# Patient Record
Sex: Female | Born: 1980 | Race: Black or African American | Hispanic: No | Marital: Single | Smoking: Never smoker
Health system: Southern US, Community
[De-identification: ages and names within clinical notes are randomized; demographics above are authoritative.]

## PROBLEM LIST (undated history)

## (undated) ENCOUNTER — Inpatient Hospital Stay (HOSPITAL_COMMUNITY): Payer: Self-pay

## (undated) DIAGNOSIS — J45909 Unspecified asthma, uncomplicated: Secondary | ICD-10-CM

## (undated) HISTORY — PX: NO PAST SURGERIES: SHX2092

---

## 2002-01-04 ENCOUNTER — Emergency Department (HOSPITAL_COMMUNITY): Admission: EM | Admit: 2002-01-04 | Discharge: 2002-01-04 | Payer: Self-pay | Admitting: Emergency Medicine

## 2005-05-05 ENCOUNTER — Emergency Department (HOSPITAL_COMMUNITY): Admission: EM | Admit: 2005-05-05 | Discharge: 2005-05-05 | Payer: Self-pay | Admitting: Emergency Medicine

## 2013-04-16 ENCOUNTER — Emergency Department (HOSPITAL_COMMUNITY)
Admission: EM | Admit: 2013-04-16 | Discharge: 2013-04-16 | Disposition: A | Discharge status: Home / Self Care | Payer: No Typology Code available for payment source | Attending: Emergency Medicine | Admitting: Emergency Medicine

## 2013-04-16 ENCOUNTER — Encounter (HOSPITAL_COMMUNITY): Payer: Self-pay | Admitting: Emergency Medicine

## 2013-04-16 DIAGNOSIS — J45909 Unspecified asthma, uncomplicated: Secondary | ICD-10-CM | POA: Insufficient documentation

## 2013-04-16 DIAGNOSIS — Z880 Allergy status to penicillin: Secondary | ICD-10-CM | POA: Insufficient documentation

## 2013-04-16 DIAGNOSIS — IMO0002 Reserved for concepts with insufficient information to code with codable children: Secondary | ICD-10-CM | POA: Insufficient documentation

## 2013-04-16 DIAGNOSIS — T148XXA Other injury of unspecified body region, initial encounter: Secondary | ICD-10-CM

## 2013-04-16 DIAGNOSIS — Y92410 Unspecified street and highway as the place of occurrence of the external cause: Secondary | ICD-10-CM | POA: Insufficient documentation

## 2013-04-16 DIAGNOSIS — Y9389 Activity, other specified: Secondary | ICD-10-CM | POA: Insufficient documentation

## 2013-04-16 HISTORY — DX: Unspecified asthma, uncomplicated: J45.909

## 2013-04-16 MED ORDER — CYCLOBENZAPRINE HCL 10 MG PO TABS: 10.0000 mg | ORAL_TABLET | Freq: Three times a day (TID) | ORAL | Status: DC | PRN

## 2013-04-16 MED ORDER — IBUPROFEN 800 MG PO TABS: 800.0000 mg | ORAL_TABLET | Freq: Three times a day (TID) | ORAL | Status: DC | PRN
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
## 2013-04-16 NOTE — ED Provider Notes (Signed)
Medical screening examination/treatment/procedure(s) were performed by non-physician practitioner and as supervising physician I was immediately available for consultation/collaboration.  Krishawna Stiefel M Annalisse Minkoff, MD 04/16/13 0747 

## 2013-08-19 ENCOUNTER — Other Ambulatory Visit: Payer: Self-pay

## 2013-08-19 LAB — OB RESULTS CONSOLE HEPATITIS B SURFACE ANTIGEN: Hepatitis B Surface Ag: NEGATIVE

## 2013-08-19 LAB — OB RESULTS CONSOLE GC/CHLAMYDIA
Chlamydia: NEGATIVE
Gonorrhea: NEGATIVE

## 2013-08-19 LAB — OB RESULTS CONSOLE RUBELLA ANTIBODY, IGM: RUBELLA: IMMUNE

## 2013-09-01 NOTE — L&D Delivery Note (Signed)
Delivery Note At 3:09 PM a viable and healthy female was delivered via Vaginal, Spontaneous Delivery (Presentation: OA, ROT ).  APGAR: 9, 9; weight P.   Placenta status: Intact, Spontaneous.  Cord:  with the following complications: nuchal.    Anesthesia: Epidural  Episiotomy:  none Lacerations: 1st degree perineal Suture Repair: 3.0 vicryl rapide Est. Blood Loss (mL): 400cc  Mom to postpartum.  Baby to Couplet care / Skin to Skin.  Bovard-Stuckert, Roshanna Cimino 03/02/2014, 3:21 PM  A+/Br/RI/Paragard  D/W mother circumcision for female infant inc r/b/a - will proceed

## 2013-12-01 LAB — OB RESULTS CONSOLE HIV ANTIBODY (ROUTINE TESTING): HIV: NONREACTIVE

## 2013-12-21 ENCOUNTER — Inpatient Hospital Stay (HOSPITAL_COMMUNITY): Payer: BC Managed Care – PPO

## 2013-12-21 ENCOUNTER — Inpatient Hospital Stay (HOSPITAL_COMMUNITY)
Admission: AD | Admit: 2013-12-21 | Discharge: 2013-12-22 | Disposition: A | Discharge status: Home / Self Care | Payer: BC Managed Care – PPO | Source: Ambulatory Visit | Attending: Obstetrics and Gynecology | Admitting: Obstetrics and Gynecology

## 2013-12-21 ENCOUNTER — Encounter (HOSPITAL_COMMUNITY): Payer: Self-pay | Admitting: Advanced Practice Midwife

## 2013-12-21 DIAGNOSIS — O99891 Other specified diseases and conditions complicating pregnancy: Secondary | ICD-10-CM | POA: Insufficient documentation

## 2013-12-21 DIAGNOSIS — O9989 Other specified diseases and conditions complicating pregnancy, childbirth and the puerperium: Secondary | ICD-10-CM

## 2013-12-21 DIAGNOSIS — O4690 Antepartum hemorrhage, unspecified, unspecified trimester: Secondary | ICD-10-CM | POA: Insufficient documentation

## 2013-12-21 DIAGNOSIS — O4693 Antepartum hemorrhage, unspecified, third trimester: Secondary | ICD-10-CM

## 2013-12-21 DIAGNOSIS — J45909 Unspecified asthma, uncomplicated: Secondary | ICD-10-CM | POA: Diagnosis not present

## 2013-12-21 LAB — COMPREHENSIVE METABOLIC PANEL
ALK PHOS: 60 U/L (ref 39–117)
ALT: 11 U/L (ref 0–35)
AST: 15 U/L (ref 0–37)
Albumin: 2.8 g/dL — ABNORMAL LOW (ref 3.5–5.2)
BILIRUBIN TOTAL: 0.2 mg/dL — AB (ref 0.3–1.2)
BUN: 6 mg/dL (ref 6–23)
CO2: 21 mEq/L (ref 19–32)
CREATININE: 0.63 mg/dL (ref 0.50–1.10)
Calcium: 8.9 mg/dL (ref 8.4–10.5)
Chloride: 101 mEq/L (ref 96–112)
GFR calc non Af Amer: >90 mL/min (ref >90)
GLUCOSE: 111 mg/dL — AB (ref 70–99)
POTASSIUM: 3.9 meq/L (ref 3.7–5.3)
Sodium: 135 mEq/L — ABNORMAL LOW (ref 137–147)
TOTAL PROTEIN: 6 g/dL (ref 6.0–8.3)

## 2013-12-21 LAB — URIC ACID: URIC ACID, SERUM: 2.7 mg/dL (ref 2.4–7.0)

## 2013-12-21 LAB — CBC
HEMATOCRIT: 30.5 % — AB (ref 36.0–46.0)
HEMOGLOBIN: 10.2 g/dL — AB (ref 12.0–15.0)
MCH: 24.9 pg — ABNORMAL LOW (ref 26.0–34.0)
MCHC: 33.4 g/dL (ref 30.0–36.0)
MCV: 74.4 fL — AB (ref 78.0–100.0)
Platelets: 217 10*3/uL (ref 150–400)
RBC: 4.1 MIL/uL (ref 3.87–5.11)
RDW: 14.4 % (ref 11.5–15.5)
WBC: 9.7 10*3/uL (ref 4.0–10.5)

## 2013-12-21 LAB — TYPE AND SCREEN
ABO/RH(D): A POS
Antibody Screen: NEGATIVE

## 2013-12-21 LAB — LACTATE DEHYDROGENASE: LDH: 204 U/L (ref 94–250)

## 2013-12-21 LAB — ABO/RH: ABO/RH(D): A POS

## 2013-12-21 MED ORDER — SODIUM CHLORIDE 0.9 % IV BOLUS (SEPSIS): 1000.0000 mL | Freq: Once | INTRAVENOUS | Status: DC

## 2013-12-21 NOTE — Progress Notes (Signed)
This note also relates to the following rows which could not be included: Pulse Rate - Cannot attach notes to unvalidated device data SpO2 - Cannot attach notes to unvalidated device data   Pt removed from monitor for bedside ultrasound

## 2013-12-21 NOTE — MAU Provider Note (Signed)
Chief Complaint:  Vaginal Bleeding and Emesis During Pregnancy   First Provider Initiated Contact with Patient 12/21/13 2059     HPI: Randa LynnLatasha C Freimuth is a 33 y.o. G2P0101 at 27.3 weeks who presents to maternity admissions reporting heavy vaginal bleeding that soaked her pants starting immediately before coming to MAU. Puddle of blood on the floor at registration desk.  Denies contractions, abd pain, leakage of fluid, HA, vision changes, epigastric pain. Good fetal movement.   Pregnancy Course: Hx 36 week delivery w/ first child and Gest HTN in that pregnancy. None this pregnancy. Larey SeatFell two months ago when walking on ice. Landed on buttocks. No bleeding or pain at that time.    Past Medical History: Past Medical History  Diagnosis Date  . Asthma     Past obstetric history: OB History  Gravida Para Term Preterm AB SAB TAB Ectopic Multiple Living  1             # Outcome Date GA Lbr Len/2nd Weight Sex Delivery Anes PTL Lv  1 CUR               Past Surgical History: No past surgical history on file.   Family History: No family history on file.  Social History: History  Substance Use Topics  . Smoking status: Never Smoker   . Smokeless tobacco: Not on file  . Alcohol Use: Yes     Comment: social    Allergies:  Allergies  Allergen Reactions  . Shellfish Allergy Anaphylaxis and Hives  . Iodine   . Penicillins Hives    Meds:  No prescriptions prior to admission    ROS: Pertinent findings in history of present illness.  Physical Exam  Blood pressure 138/75, pulse 84, resp. rate 16, last menstrual period 03/21/2013, SpO2 98.00%. Patient Vitals for the past 24 hrs:  BP Pulse Resp SpO2  12/22/13 0105 - - 16 -  12/21/13 2305 - 84 - 98 %  12/21/13 2202 138/75 mmHg 92 - -  12/21/13 2155 128/80 mmHg 102 - -  12/21/13 2112 129/79 mmHg 96 - -  12/21/13 2052 156/89 mmHg 107 - -  12/21/13 2051 160/83 mmHg 100 - -   GENERAL: Well-developed, well-nourished female in no  acute distress. Anxious. HEENT: normocephalic HEART: normal rate RESP: normal effort ABDOMEN: Soft, non-tender, gravid appropriate for gestational age.  EXTREMITIES: Nontender, no edema NEURO: alert and oriented SPECULUM EXAM: NEFG, 6 cm calcified, dark red clot in vagina, small amount of blood in vault, cervix clean. Small amount of blood on pad while in MAU.  Cervix long and closed.   FHT:  Baseline 150, moderate variability, accelerations present, no decelerations Contractions: None, soft   Labs: Results for orders placed during the hospital encounter of 12/21/13 (from the past 24 hour(s))  TYPE AND SCREEN     Status: None   Collection Time    12/21/13  8:50 PM      Result Value Ref Range   ABO/RH(D) A POS     Antibody Screen NEG     Sample Expiration 12/24/2013    CBC     Status: Abnormal   Collection Time    12/21/13  8:50 PM      Result Value Ref Range   WBC 9.7  4.0 - 10.5 K/uL   RBC 4.10  3.87 - 5.11 MIL/uL   Hemoglobin 10.2 (*) 12.0 - 15.0 g/dL   HCT 40.930.5 (*) 81.136.0 - 91.446.0 %   MCV 74.4 (*) 78.0 -  100.0 fL   MCH 24.9 (*) 26.0 - 34.0 pg   MCHC 33.4  30.0 - 36.0 g/dL   RDW 16.114.4  09.611.5 - 04.515.5 %   Platelets 217  150 - 400 K/uL  COMPREHENSIVE METABOLIC PANEL     Status: Abnormal   Collection Time    12/21/13  8:50 PM      Result Value Ref Range   Sodium 135 (*) 137 - 147 mEq/L   Potassium 3.9  3.7 - 5.3 mEq/L   Chloride 101  96 - 112 mEq/L   CO2 21  19 - 32 mEq/L   Glucose, Bld 111 (*) 70 - 99 mg/dL   BUN 6  6 - 23 mg/dL   Creatinine, Ser 4.090.63  0.50 - 1.10 mg/dL   Calcium 8.9  8.4 - 81.110.5 mg/dL   Total Protein 6.0  6.0 - 8.3 g/dL   Albumin 2.8 (*) 3.5 - 5.2 g/dL   AST 15  0 - 37 U/L   ALT 11  0 - 35 U/L   Alkaline Phosphatase 60  39 - 117 U/L   Total Bilirubin 0.2 (*) 0.3 - 1.2 mg/dL   GFR calc non Af Amer >90  >90 mL/min   GFR calc Af Amer >90  >90 mL/min  URIC ACID     Status: None   Collection Time    12/21/13  8:50 PM      Result Value Ref Range   Uric  Acid, Serum 2.7  2.4 - 7.0 mg/dL  LACTATE DEHYDROGENASE     Status: None   Collection Time    12/21/13  8:50 PM      Result Value Ref Range   LDH 204  94 - 250 U/L  ABO/RH     Status: None   Collection Time    12/21/13  8:50 PM      Result Value Ref Range   ABO/RH(D) A POS      Imaging:  Vtx.  Subjectively normal fluid Anterior placenta. No previa No evidence of abruption CL 3.7 cm  MAU Course: CBC, type and screen, IV, NPO, BS limited US to eval placenta.  Bleeding minimal throughout MAU visit. VSS. BP settled down after pt reassured of normal FHR.   Assessment: 1. Third trimester bleeding-suspect partial abruption.     Plan: Discharge home per consult w/ Dr. Ambrose MantleHenley. No BMZ at this time.   Abruption precautions. Preterm labor precautions and fetal kick counts Call 911 if new heavy bleeding returns.      Follow-up Information   Call [facility]. (call in the morning to ask about follow-up appointment)    Contact information:   [address]  [address] [place] [address] [phone]       [facility] up with [facility]. (As needed if bleeding resumes  )    Contact information:   [id number] [place]  [place] [id number] [phone]       Medication List    Notice   You have not been prescribed any medications.     [place] Owenn Rothermel, [place] 12/22/2013 2:53 AM

## 2013-12-21 NOTE — Progress Notes (Signed)
This note also relates to the following rows which could not be included: Pulse Rate - pt

## 2013-12-21 NOTE — MAU Note (Signed)
Pt states she started bleeding bright red blood about 2015

## 2013-12-22 DIAGNOSIS — O4690 Antepartum hemorrhage, unspecified, unspecified trimester: Secondary | ICD-10-CM | POA: Diagnosis not present

## 2013-12-22 NOTE — Discharge Instructions (Signed)
Placental Abruption Placental abruption is when the placenta partially or completely separates from the uterus before the baby (fetus) is born. The placenta is the organ that provides nourishment to the baby. Normally, the placenta does not detach from the womb until after the baby is born. When it is large and separates before the baby is born, it may be a threat to the baby and mother's life. A small abruption may not be noticed until after the birth. Placenta abruption is uncommon. CAUSES  Often times, your caregiver will not know the cause. However, some uncommon causes include:   Abdominal injury.  Turning a baby that is presenting their buttocks first (breech) or is lying sideways in the uterus (transverse) to a headfirst position (external cephalic version).  Delivering the first twin.  Sudden loss of amniotic fluid (premature rupture of the membranes).  Abnormally short umbilical cord. SYMPTOMS  When the placental separation is small, it may not produce symptoms. There may be a small amount of belly (abdominal) pain or slight amount of vaginal bleeding.  Symptoms of severe problems will depend on the size of the separation and the stage of pregnancy. Symptoms may include:   Vaginal bleeding.  Uterine tenderness.  Fetal distress detected by fetal monitoring.  Severe abdominal pain with tenderness.  Continual uterine contraction (tetany).  Back pain.  Maternal shock with severe hemorrhage. RISK FACTORS  History of abruption.  High blood pressure.  Smoking and alcohol intake.  Blood clotting problems.  Too much fluid in the baby's sac (polyhydramnios).  Twins or more.  High blood pressure during pregnancy (preeclampsia) or seizures and convulsions (eclampsia).  Diabetes.  Having had more than four children.  Pregnancy in older women (54 years or older).  Illegal drugs.  Injury or trauma to the abdomen. PREVENTION  Prevention begins with good prenatal  care:  Stop using alcohol, illegal drugs and smoking.  Obey traffic laws and practice defensive driving.  Avoid dangerous activities such as snow and water skiing, horseback riding, motorcycles and mountain climbing.  Wear seat belts properly and at all times.  Control high blood pressure and diabetes.  Avoid situations where there is domestic violence. DIAGNOSIS  Placental abruption is suspected when a pregnant woman develops sudden uterine pain with or without bleeding. The uterus usually is very tender and hard. It may be enlarging because of the bleeding and the fetus may show signs of distress. Distress may show up as an abnormal heart rate or rhythm. When your caregiver sees these signs, they may do an ultrasound test to look for a clot behind the placenta. They will also do blood work to make sure there are not clotting problems, signs of too much blood loss, or not enough healthy red blood cells (anemia). These all require a blood transfusion. TREATMENT  Treatment depends on many things such as:   The amount of bleeding.  Distress with the baby or mother.  How far along the pregnancy is.  The maturity of the baby. This condition is usually an emergency. When the mother or fetus is in distress, it requires treatment right away to protect the safety of the mother and infant. If the baby is mature and delivery time is near:   Careful observation may allow the baby to be delivered vaginally. A vaginal birth is usually preferred over caesarean section unless there is fetal distress.  Sometimes, a caesarean section cannot be done if there are clotting problems or a DIC. If the symptoms are severe and  delivery is not about to happen:   A cesarean section may be done. This is an operation on the abdomen to remove the baby. If the symptoms are mild and there are no signs of distress with the baby or mother:   You may have to stay in the hospital for a couple of days for  observation.  You may be given steroid medication to get the baby's lungs mature when necessary.  If you are Rh negative and the father is Rh positive, you may get Rho-gam to prevent Rh problems in the baby.  When everything is ok and safe, you may go home and be placed on bed rest. HOME CARE INSTRUCTIONS   Take all medications as directed by your caregiver.  Keep all your follow-up prenatal visits.  Arrange for help at home before and after you deliver the baby, especially if you had a Cesarean section or a large amount of bleeding.  Get plenty of rest and sleep, especially after the baby is born.  Eat a nutritious and balanced diet.  Do not have sexual intercourse, use tampons or douche with out your caregiver's permission. SEEK IMMEDIATE MEDICAL CARE IF: Before delivery:  Any type of vaginal bleeding.  Abdominal pain  Continuous uterine contractions.  A hard, tender uterus.  You do not feel the baby move or the baby has very little movement. After delivery:  Started to pass large clots or pieces of tissue. This may be small pieces of placenta left following delivery.  Noticed that you are soaking more than one sanitary pad per hour, for several hours.  Heavy, bright-red bleeding which occurs four days or more after delivery.  A vaginal discharge which has a bad smell.  An unexplained oral temperature above 100 F (37.8 C).  Episodes of lightheadedness or fainting.  Shortness of breath or a rapid heartbeat with very little activity (exertion).  Abdominal pain.  Leg or chest pain. If you are having any of these symptoms, call your caregiver right away. Document Released: 08/18/2005 Document Revised: 11/10/2011 Document Reviewed: 12/07/2008 [facility] Patient Information 2014 [place], [place].   Pelvic Rest Pelvic rest is sometimes recommended for women when:   The placenta is partially or completely covering the opening of the cervix (placenta  previa).  There is bleeding between the uterine wall and the amniotic sac in the first trimester (subchorionic hemorrhage).  The cervix begins to open without labor starting (incompetent cervix, cervical insufficiency).  The labor is too early (preterm labor). HOME CARE INSTRUCTIONS  Do not have sexual intercourse, stimulation, or an orgasm.  Do not use tampons, douche, or put anything in the vagina.  Do not lift anything over 10 pounds (4.5 kg).  Avoid strenuous activity or straining your pelvic muscles. SEEK MEDICAL CARE IF:  You have any vaginal bleeding during pregnancy. Treat this as a potential emergency.  You have cramping pain felt low in the stomach (stronger than menstrual cramps).  You notice vaginal discharge (watery, mucus, or bloody).  You have a low, dull backache.  There are regular contractions or uterine tightening. SEEK IMMEDIATE MEDICAL CARE IF: You have vaginal bleeding and have placenta previa.  Document Released: 12/13/2010 Document Revised: 11/10/2011 Document Reviewed: 12/13/2010 [facility] Patient Information 2014 [place], [place].  Fetal Movement Counts Patient Name: __________________________________________________ Patient Due Date: ____________________ Performing a fetal movement count is highly recommended in high-risk pregnancies, but it is good for every pregnant woman to do. Your caregiver may ask you to start counting fetal movements at  28 weeks of the pregnancy. Fetal movements often increase:  After eating a full meal.  After physical activity.  After eating or drinking something sweet or cold.  At rest. Pay attention to when you feel the baby is most active. This will help you notice a pattern of your baby's sleep and wake cycles and what factors contribute to an increase in fetal movement. It is important to perform a fetal movement count at the same time each day when your baby is normally most active.  HOW TO COUNT FETAL  MOVEMENTS 1. Find a quiet and comfortable area to sit or lie down on your left side. Lying on your left side provides the best blood and oxygen circulation to your baby. 2. Write down the day and time on a sheet of paper or in a journal. 3. Start counting kicks, flutters, swishes, rolls, or jabs in a 2 hour period. You should feel at least 10 movements within 2 hours. 4. If you do not feel 10 movements in 2 hours, wait 2 3 hours and count again. Look for a change in the pattern or not enough counts in 2 hours. SEEK MEDICAL CARE IF:  You feel less than 10 counts in 2 hours, tried twice.  There is no movement in over an hour.  The pattern is changing or taking longer each day to reach 10 counts in 2 hours.  You feel the baby is not moving as he or she usually does. Date: ____________ Movements: ____________ Start time: ____________ Doreatha MartinFinish time: ____________  Date: ____________ Movements: ____________ Start time: ____________ Doreatha MartinFinish time: ____________ Date: ____________ Movements: ____________ Start time: ____________ Doreatha MartinFinish time: ____________ Date: ____________ Movements: ____________ Start time: ____________ Doreatha MartinFinish time: ____________ Date: ____________ Movements: ____________ Start time: ____________ Doreatha MartinFinish time: ____________ Date: ____________ Movements: ____________ Start time: ____________ Doreatha MartinFinish time: ____________ Date: ____________ Movements: ____________ Start time: ____________ Doreatha MartinFinish time: ____________ Date: ____________ Movements: ____________ Start time: ____________ Doreatha MartinFinish time: ____________  Date: ____________ Movements: ____________ Start time: ____________ Doreatha MartinFinish time: ____________ Date: ____________ Movements: ____________ Start time: ____________ Doreatha MartinFinish time: ____________ Date: ____________ Movements: ____________ Start time: ____________ Doreatha MartinFinish time: ____________ Date: ____________ Movements: ____________ Start time: ____________ Doreatha MartinFinish time: ____________ Date: ____________  Movements: ____________ Start time: ____________ Doreatha MartinFinish time: ____________ Date: ____________ Movements: ____________ Start time: ____________ Doreatha MartinFinish time: ____________ Date: ____________ Movements: ____________ Start time: ____________ Doreatha MartinFinish time: ____________  Date: ____________ Movements: ____________ Start time: ____________ Doreatha MartinFinish time: ____________ Date: ____________ Movements: ____________ Start time: ____________ Doreatha MartinFinish time: ____________ Date: ____________ Movements: ____________ Start time: ____________ Doreatha MartinFinish time: ____________ Date: ____________ Movements: ____________ Start time: ____________ Doreatha MartinFinish time: ____________ Date: ____________ Movements: ____________ Start time: ____________ Doreatha MartinFinish time: ____________ Date: ____________ Movements: ____________ Start time: ____________ Doreatha MartinFinish time: ____________ Date: ____________ Movements: ____________ Start time: ____________ Doreatha MartinFinish time: ____________  Date: ____________ Movements: ____________ Start time: ____________ Doreatha MartinFinish time: ____________ Date: ____________ Movements: ____________ Start time: ____________ Doreatha MartinFinish time: ____________ Date: ____________ Movements: ____________ Start time: ____________ Doreatha MartinFinish time: ____________ Date: ____________ Movements: ____________ Start time: ____________ Doreatha MartinFinish time: ____________ Date: ____________ Movements: ____________ Start time: ____________ Doreatha MartinFinish time: ____________ Date: ____________ Movements: ____________ Start time: ____________ Doreatha MartinFinish time: ____________ Date: ____________ Movements: ____________ Start time: ____________ Doreatha MartinFinish time: ____________  Date: ____________ Movements: ____________ Start time: ____________ Doreatha MartinFinish time: ____________ Date: ____________ Movements: ____________ Start time: ____________ Doreatha MartinFinish time: ____________ Date: ____________ Movements: ____________ Start time: ____________ Doreatha MartinFinish time: ____________ Date: ____________ Movements: ____________ Start time:  ____________ Doreatha MartinFinish time: ____________ Date: ____________ Movements: ____________ Start time: ____________ Doreatha MartinFinish time:  ____________ Date: ____________ Movements: ____________ Start time: ____________ Doreatha MartinFinish time: ____________ Date: ____________ Movements: ____________ Start time: ____________ Doreatha MartinFinish time: ____________  Date: ____________ Movements: ____________ Start time: ____________ Doreatha MartinFinish time: ____________ Date: ____________ Movements: ____________ Start time: ____________ Doreatha MartinFinish time: ____________ Date: ____________ Movements: ____________ Start time: ____________ Doreatha MartinFinish time: ____________ Date: ____________ Movements: ____________ Start time: ____________ Doreatha MartinFinish time: ____________ Date: ____________ Movements: ____________ Start time: ____________ Doreatha MartinFinish time: ____________ Date: ____________ Movements: ____________ Start time: ____________ Doreatha MartinFinish time: ____________ Date: ____________ Movements: ____________ Start time: ____________ Doreatha MartinFinish time: ____________  Date: ____________ Movements: ____________ Start time: ____________ Doreatha MartinFinish time: ____________ Date: ____________ Movements: ____________ Start time: ____________ Doreatha MartinFinish time: ____________ Date: ____________ Movements: ____________ Start time: ____________ Doreatha MartinFinish time: ____________ Date: ____________ Movements: ____________ Start time: ____________ Doreatha MartinFinish time: ____________ Date: ____________ Movements: ____________ Start time: ____________ Doreatha MartinFinish time: ____________ Date: ____________ Movements: ____________ Start time: ____________ Doreatha MartinFinish time: ____________ Date: ____________ Movements: ____________ Start time: ____________ Doreatha MartinFinish time: ____________  Date: ____________ Movements: ____________ Start time: ____________ Doreatha MartinFinish time: ____________ Date: ____________ Movements: ____________ Start time: ____________ Doreatha MartinFinish time: ____________ Date: ____________ Movements: ____________ Start time: ____________ Doreatha MartinFinish time: ____________ Date:  ____________ Movements: ____________ Start time: ____________ Doreatha MartinFinish time: ____________ Date: ____________ Movements: ____________ Start time: ____________ Doreatha MartinFinish time: ____________ Date: ____________ Movements: ____________ Start time: ____________ Doreatha MartinFinish time: ____________ Document Released: 09/17/2006 Document Revised: 08/04/2012 Document Reviewed: 06/14/2012 [employer] Patient Information 2014 [employer].

## 2014-01-10 ENCOUNTER — Inpatient Hospital Stay (HOSPITAL_COMMUNITY)
Admission: AD | Admit: 2014-01-10 | Discharge: 2014-01-10 | Disposition: A | Discharge status: Home / Self Care | Payer: BC Managed Care – PPO | Source: Ambulatory Visit | Attending: Obstetrics and Gynecology | Admitting: Obstetrics and Gynecology

## 2014-01-10 ENCOUNTER — Encounter (HOSPITAL_COMMUNITY): Payer: Self-pay | Admitting: *Deleted

## 2014-01-10 DIAGNOSIS — O4700 False labor before 37 completed weeks of gestation, unspecified trimester: Secondary | ICD-10-CM | POA: Insufficient documentation

## 2014-01-10 DIAGNOSIS — Z880 Allergy status to penicillin: Secondary | ICD-10-CM | POA: Insufficient documentation

## 2014-01-10 DIAGNOSIS — O4693 Antepartum hemorrhage, unspecified, third trimester: Secondary | ICD-10-CM

## 2014-01-10 DIAGNOSIS — O4690 Antepartum hemorrhage, unspecified, unspecified trimester: Secondary | ICD-10-CM | POA: Diagnosis present

## 2014-01-10 MED ORDER — BETAMETHASONE SOD PHOS & ACET 6 (3-3) MG/ML IJ SUSP
12.0000 mg | INTRAMUSCULAR | Status: DC
  Administered 2014-01-10: 12 mg via INTRAMUSCULAR
  Filled 2014-01-10: qty 2

## 2014-01-10 NOTE — MAU Note (Signed)
[facility] a [facility]. Pt has also been evaluated here about 3 wks ago.

## 2014-01-10 NOTE — MAU Provider Note (Signed)
Chief Complaint:  Vaginal Bleeding   First Provider Initiated Contact with Patient 01/10/14 2119     HPI: Alicia Butler is a 33 y.o. G2P1 at 6429w2d who was sent to to maternity admissions by Dr. Ellyn HackBovard for betamethasone injection and NST. Had an episode of vaginal bleeding 2 days ago. None now. Has a suspected partial abruption.  Denies contractions, leakage of fluid or vaginal bleeding. Good fetal movement.   Past Medical History: Past Medical History  Diagnosis Date  . Asthma     Past obstetric history: OB History  Gravida Para Term Preterm AB SAB TAB Ectopic Multiple Living  2 1            # Outcome Date GA Lbr Len/2nd Weight Sex Delivery Anes PTL Lv  2 CUR           1 PAR               Past Surgical History: Past Surgical History  Procedure Laterality Date  . No past surgeries       Family History: Family History  Problem Relation Age of Onset  . Asthma Father   . Hypertension Father   . Cancer Paternal Aunt   . Heart disease Paternal Uncle   . Diabetes Maternal Grandmother     Social History: History  Substance Use Topics  . Smoking status: Never Smoker   . Smokeless tobacco: Not on file  . Alcohol Use: Yes     Comment: social    Allergies:  Allergies  Allergen Reactions  . Iodine Anaphylaxis  . Shellfish Allergy Anaphylaxis and Hives  . Penicillins Hives    Meds:  Prescriptions prior to admission  Medication Sig Dispense Refill  . acetaminophen (TYLENOL) 500 MG tablet Take 500 mg by mouth every 6 (six) hours as needed for moderate pain.      Marland Kitchen. albuterol (PROVENTIL HFA;VENTOLIN HFA) 108 (90 BASE) MCG/ACT inhaler Inhale 2 puffs into the lungs every 6 (six) hours as needed for wheezing or shortness of breath.      . Prenatal Vit-Min-FA-Fish Oil ([employer] PRENATAL GUMMY PO) Take 2 tablets by mouth daily.        ROS: Pertinent findings in history of present illness.  Physical Exam  Last menstrual period 03/21/2013. GENERAL: Well-developed,  well-nourished female in no acute distress.  HEENT: normocephalic HEART: normal rate RESP: normal effort ABDOMEN: Soft, non-tender, gravid appropriate for gestational age EXTREMITIES: Nontender, no edema NEURO: alert and oriented SPECULUM EXAM: Deferred   FHT:  Baseline 150 , moderate variability, accelerations present, no decelerations Contractions: none   Labs: No results found for this or any previous visit (from the past 24 hour(s)).  Imaging:  N/A  MAU Course: Betamethasone given.  Assessment: 1. Third trimester bleeding, antepartum    Plan: Discharge home in stable condition per consult with Dr. Ellyn HackBovard. Preterm labor precautions and fetal kick counts  abruption precautions. Follow-up Information   Follow up with Bovard-Stuckert, Augusto GambleJody, MD. (As scheduled or , As needed, If symptoms worsen)    Specialty:  Obstetrics and Gynecology   Contact information:   [address] [place] [id number] [phone]       [facility] In 1 day. (for second steriod shot)    Contact information:   [address] [id number] [place] [id number] [phone]        Medication List         acetaminophen  500 MG tablet  Commonly known as:  TYLENOL  Take 500 mg by mouth every 6 (six) hours as needed for moderate pain.     albuterol 108 (90 BASE) MCG/ACT inhaler  Commonly known as:  PROVENTIL HFA;VENTOLIN HFA  Inhale 2 puffs into the lungs every 6 (six) hours as needed for wheezing or shortness of breath.     [employer] PRENATAL GUMMY PO  Take 2 tablets by mouth daily.       [place] Lavella Myren, CNM 01/10/2014 9:06 PM

## 2014-01-10 NOTE — Discharge Instructions (Signed)
Placental Abruption Placental abruption is when the placenta partially or completely separates from the uterus before the baby (fetus) is born. The placenta is the organ that provides nourishment to the baby. Normally, the placenta does not detach from the womb until after the baby is born. When it is large and separates before the baby is born, it may be a threat to the baby and mother's life. A small abruption may not be noticed until after the birth. Placenta abruption is uncommon. CAUSES  Often times, your caregiver will not know the cause. However, some uncommon causes include:   Abdominal injury.  Turning a baby that is presenting their buttocks first (breech) or is lying sideways in the uterus (transverse) to a headfirst position (external cephalic version).  Delivering the first twin.  Sudden loss of amniotic fluid (premature rupture of the membranes).  Abnormally short umbilical cord. SYMPTOMS  When the placental separation is small, it may not produce symptoms. There may be a small amount of belly (abdominal) pain or slight amount of vaginal bleeding.  Symptoms of severe problems will depend on the size of the separation and the stage of pregnancy. Symptoms may include:   Vaginal bleeding.  Uterine tenderness.  Fetal distress detected by fetal monitoring.  Severe abdominal pain with tenderness.  Continual uterine contraction (tetany).  Back pain.  Maternal shock with severe hemorrhage. RISK FACTORS  History of abruption.  High blood pressure.  Smoking and alcohol intake.  Blood clotting problems.  Too much fluid in the baby's sac (polyhydramnios).  Twins or more.  High blood pressure during pregnancy (preeclampsia) or seizures and convulsions (eclampsia).  Diabetes.  Having had more than four children.  Pregnancy in older women (54 years or older).  Illegal drugs.  Injury or trauma to the abdomen. PREVENTION  Prevention begins with good prenatal  care:  Stop using alcohol, illegal drugs and smoking.  Obey traffic laws and practice defensive driving.  Avoid dangerous activities such as snow and water skiing, horseback riding, motorcycles and mountain climbing.  Wear seat belts properly and at all times.  Control high blood pressure and diabetes.  Avoid situations where there is domestic violence. DIAGNOSIS  Placental abruption is suspected when a pregnant woman develops sudden uterine pain with or without bleeding. The uterus usually is very tender and hard. It may be enlarging because of the bleeding and the fetus may show signs of distress. Distress may show up as an abnormal heart rate or rhythm. When your caregiver sees these signs, they may do an ultrasound test to look for a clot behind the placenta. They will also do blood work to make sure there are not clotting problems, signs of too much blood loss, or not enough healthy red blood cells (anemia). These all require a blood transfusion. TREATMENT  Treatment depends on many things such as:   The amount of bleeding.  Distress with the baby or mother.  How far along the pregnancy is.  The maturity of the baby. This condition is usually an emergency. When the mother or fetus is in distress, it requires treatment right away to protect the safety of the mother and infant. If the baby is mature and delivery time is near:   Careful observation may allow the baby to be delivered vaginally. A vaginal birth is usually preferred over caesarean section unless there is fetal distress.  Sometimes, a caesarean section cannot be done if there are clotting problems or a DIC. If the symptoms are severe and  delivery is not about to happen:   A cesarean section may be done. This is an operation on the abdomen to remove the baby. If the symptoms are mild and there are no signs of distress with the baby or mother:   You may have to stay in the hospital for a couple of days for  observation.  You may be given steroid medication to get the baby's lungs mature when necessary.  If you are Rh negative and the father is Rh positive, you may get Rho-gam to prevent Rh problems in the baby.  When everything is ok and safe, you may go home and be placed on bed rest. HOME CARE INSTRUCTIONS   Take all medications as directed by your caregiver.  Keep all your follow-up prenatal visits.  Arrange for help at home before and after you deliver the baby, especially if you had a Cesarean section or a large amount of bleeding.  Get plenty of rest and sleep, especially after the baby is born.  Eat a nutritious and balanced diet.  Do not have sexual intercourse, use tampons or douche with out your caregiver's permission. SEEK IMMEDIATE MEDICAL CARE IF: Before delivery:  Any type of vaginal bleeding.  Abdominal pain  Continuous uterine contractions.  A hard, tender uterus.  You do not feel the baby move or the baby has very little movement. After delivery:  Started to pass large clots or pieces of tissue. This may be small pieces of placenta left following delivery.  Noticed that you are soaking more than one sanitary pad per hour, for several hours.  Heavy, bright-red bleeding which occurs four days or more after delivery.  A vaginal discharge which has a bad smell.  An unexplained oral temperature above 100 F (37.8 C).  Episodes of lightheadedness or fainting.  Shortness of breath or a rapid heartbeat with very little activity (exertion).  Abdominal pain.  Leg or chest pain. If you are having any of these symptoms, call your caregiver right away. Document Released: 08/18/2005 Document Revised: 11/10/2011 Document Reviewed: 12/07/2008 [facility] Patient Information 2014 [place], [place].  Fetal Movement Counts Patient Name: __________________________________________________ Patient Due Date: ____________________ Performing a fetal movement count is highly  recommended in high-risk pregnancies, but it is good for every pregnant woman to do. Your caregiver may ask you to start counting fetal movements at 28 weeks of the pregnancy. Fetal movements often increase:  After eating a full meal.  After physical activity.  After eating or drinking something sweet or cold.  At rest. Pay attention to when you feel the baby is most active. This will help you notice a pattern of your baby's sleep and wake cycles and what factors contribute to an increase in fetal movement. It is important to perform a fetal movement count at the same time each day when your baby is normally most active.  HOW TO COUNT FETAL MOVEMENTS 1. Find a quiet and comfortable area to sit or lie down on your left side. Lying on your left side provides the best blood and oxygen circulation to your baby. 2. Write down the day and time on a sheet of paper or in a journal. 3. Start counting kicks, flutters, swishes, rolls, or jabs in a 2 hour period. You should feel at least 10 movements within 2 hours. 4. If you do not feel 10 movements in 2 hours, wait 2 3 hours and count again. Look for a change in the pattern or not enough counts in 2  hours. SEEK MEDICAL CARE IF:  You feel less than 10 counts in 2 hours, tried twice.  There is no movement in over an hour.  The pattern is changing or taking longer each day to reach 10 counts in 2 hours.  You feel the baby is not moving as he or she usually does. Date: ____________ Movements: ____________ Start time: ____________ Alicia Butler time: ____________  Date: ____________ Movements: ____________ Start time: ____________ Alicia Butler time: ____________ Date: ____________ Movements: ____________ Start time: ____________ Alicia Butler time: ____________ Date: ____________ Movements: ____________ Start time: ____________ Alicia Butler time: ____________ Date: ____________ Movements: ____________ Start time: ____________ Alicia Butler time: ____________ Date: ____________  Movements: ____________ Start time: ____________ Alicia Butler time: ____________ Date: ____________ Movements: ____________ Start time: ____________ Alicia Butler time: ____________ Date: ____________ Movements: ____________ Start time: ____________ Alicia Butler time: ____________  Date: ____________ Movements: ____________ Start time: ____________ Alicia Butler time: ____________ Date: ____________ Movements: ____________ Start time: ____________ Alicia Butler time: ____________ Date: ____________ Movements: ____________ Start time: ____________ Alicia Butler time: ____________ Date: ____________ Movements: ____________ Start time: ____________ Alicia Butler time: ____________ Date: ____________ Movements: ____________ Start time: ____________ Alicia Butler time: ____________ Date: ____________ Movements: ____________ Start time: ____________ Alicia Butler time: ____________ Date: ____________ Movements: ____________ Start time: ____________ Alicia Butler time: ____________  Date: ____________ Movements: ____________ Start time: ____________ Alicia Butler time: ____________ Date: ____________ Movements: ____________ Start time: ____________ Alicia Butler time: ____________ Date: ____________ Movements: ____________ Start time: ____________ Alicia Butler time: ____________ Date: ____________ Movements: ____________ Start time: ____________ Alicia Butler time: ____________ Date: ____________ Movements: ____________ Start time: ____________ Alicia Butler time: ____________ Date: ____________ Movements: ____________ Start time: ____________ Alicia Butler time: ____________ Date: ____________ Movements: ____________ Start time: ____________ Alicia Butler time: ____________  Date: ____________ Movements: ____________ Start time: ____________ Alicia Butler time: ____________ Date: ____________ Movements: ____________ Start time: ____________ Alicia Butler time: ____________ Date: ____________ Movements: ____________ Start time: ____________ Alicia Butler time: ____________ Date: ____________ Movements: ____________ Start time:  ____________ Alicia Butler time: ____________ Date: ____________ Movements: ____________ Start time: ____________ Alicia Butler time: ____________ Date: ____________ Movements: ____________ Start time: ____________ Alicia Butler time: ____________ Date: ____________ Movements: ____________ Start time: ____________ Alicia Butler time: ____________  Date: ____________ Movements: ____________ Start time: ____________ Alicia Butler time: ____________ Date: ____________ Movements: ____________ Start time: ____________ Alicia Butler time: ____________ Date: ____________ Movements: ____________ Start time: ____________ Alicia Butler time: ____________ Date: ____________ Movements: ____________ Start time: ____________ Alicia Butler time: ____________ Date: ____________ Movements: ____________ Start time: ____________ Alicia Butler time: ____________ Date: ____________ Movements: ____________ Start time: ____________ Alicia Butler time: ____________ Date: ____________ Movements: ____________ Start time: ____________ Alicia Butler time: ____________  Date: ____________ Movements: ____________ Start time: ____________ Alicia Butler time: ____________ Date: ____________ Movements: ____________ Start time: ____________ Alicia Butler time: ____________ Date: ____________ Movements: ____________ Start time: ____________ Alicia Butler time: ____________ Date: ____________ Movements: ____________ Start time: ____________ Alicia Butler time: ____________ Date: ____________ Movements: ____________ Start time: ____________ Alicia Butler time: ____________ Date: ____________ Movements: ____________ Start time: ____________ Alicia Butler time: ____________ Date: ____________ Movements: ____________ Start time: ____________ Alicia Butler time: ____________  Date: ____________ Movements: ____________ Start time: ____________ Alicia Butler time: ____________ Date: ____________ Movements: ____________ Start time: ____________ Alicia Butler time: ____________ Date: ____________ Movements: ____________ Start time: ____________ Alicia Butler time: ____________ Date:  ____________ Movements: ____________ Start time: ____________ Alicia Butler time: ____________ Date: ____________ Movements: ____________ Start time: ____________ Alicia Butler time: ____________ Date: ____________ Movements: ____________ Start time: ____________ Alicia Butler time: ____________ Date: ____________ Movements: ____________ Start time: ____________ Alicia Butler time: ____________  Date: ____________ Movements: ____________ Start time: ____________ Alicia Butler time: ____________ Date: ____________ Movements: ____________ Start time: ____________ Alicia Butler time: ____________ Date: ____________ Movements: ____________ Start time: ____________ Alicia Butler time: ____________ Date: ____________  Movements: ____________ Start time: ____________ Alicia MartinFinish time: ____________ Date: ____________ Movements: ____________ Start time: ____________ Alicia MartinFinish time: ____________ Date: ____________ Movements: ____________ Start time: ____________ Alicia MartinFinish time: ____________ Document Released: 09/17/2006 Document Revised: 08/04/2012 Document Reviewed: 06/14/2012 ExitCare Patient Information 2014 [place], [place].

## 2014-01-11 ENCOUNTER — Inpatient Hospital Stay (HOSPITAL_COMMUNITY)
Admission: AD | Admit: 2014-01-11 | Discharge: 2014-01-11 | Disposition: A | Discharge status: Home / Self Care | Payer: BC Managed Care – PPO | Source: Ambulatory Visit | Attending: Obstetrics and Gynecology | Admitting: Obstetrics and Gynecology

## 2014-01-11 DIAGNOSIS — O4700 False labor before 37 completed weeks of gestation, unspecified trimester: Secondary | ICD-10-CM | POA: Diagnosis present

## 2014-01-11 MED ORDER — BETAMETHASONE SOD PHOS & ACET 6 (3-3) MG/ML IJ SUSP
12.0000 mg | Freq: Once | INTRAMUSCULAR | Status: AC
  Administered 2014-01-11: 12 mg via INTRAMUSCULAR
  Filled 2014-01-11: qty 2

## 2014-01-11 NOTE — MAU Note (Signed)
No complaints. Positive fetal movement. Here for 2nd betamethasone injection only.

## 2014-01-17 ENCOUNTER — Other Ambulatory Visit (HOSPITAL_COMMUNITY): Payer: Self-pay | Admitting: Obstetrics and Gynecology

## 2014-01-17 DIAGNOSIS — O2690 Pregnancy related conditions, unspecified, unspecified trimester: Secondary | ICD-10-CM

## 2014-01-18 ENCOUNTER — Ambulatory Visit (HOSPITAL_COMMUNITY)
Admission: RE | Admit: 2014-01-18 | Discharge: 2014-01-18 | Disposition: A | Discharge status: Home / Self Care | Payer: BC Managed Care – PPO | Source: Ambulatory Visit | Attending: Obstetrics and Gynecology | Admitting: Obstetrics and Gynecology

## 2014-01-18 ENCOUNTER — Other Ambulatory Visit (HOSPITAL_COMMUNITY): Payer: Self-pay | Admitting: Obstetrics and Gynecology

## 2014-01-18 ENCOUNTER — Ambulatory Visit (HOSPITAL_COMMUNITY): Admission: RE | Admit: 2014-01-18 | Payer: BC Managed Care – PPO | Source: Ambulatory Visit

## 2014-01-18 DIAGNOSIS — O9989 Other specified diseases and conditions complicating pregnancy, childbirth and the puerperium: Secondary | ICD-10-CM | POA: Insufficient documentation

## 2014-01-18 DIAGNOSIS — O2690 Pregnancy related conditions, unspecified, unspecified trimester: Secondary | ICD-10-CM

## 2014-01-18 DIAGNOSIS — Z3689 Encounter for other specified antenatal screening: Secondary | ICD-10-CM | POA: Insufficient documentation

## 2014-02-15 ENCOUNTER — Inpatient Hospital Stay (HOSPITAL_COMMUNITY): Payer: BC Managed Care – PPO

## 2014-02-15 ENCOUNTER — Encounter (HOSPITAL_COMMUNITY): Payer: Self-pay | Admitting: *Deleted

## 2014-02-15 ENCOUNTER — Inpatient Hospital Stay (HOSPITAL_COMMUNITY)
Admission: AD | Admit: 2014-02-15 | Discharge: 2014-02-22 | DRG: 782 | Disposition: A | Discharge status: Home / Self Care | Payer: BC Managed Care – PPO | Source: Ambulatory Visit | Attending: Obstetrics and Gynecology | Admitting: Obstetrics and Gynecology

## 2014-02-15 DIAGNOSIS — Z8249 Family history of ischemic heart disease and other diseases of the circulatory system: Secondary | ICD-10-CM | POA: Diagnosis not present

## 2014-02-15 DIAGNOSIS — Z833 Family history of diabetes mellitus: Secondary | ICD-10-CM | POA: Diagnosis not present

## 2014-02-15 DIAGNOSIS — O4700 False labor before 37 completed weeks of gestation, unspecified trimester: Secondary | ICD-10-CM | POA: Diagnosis present

## 2014-02-15 DIAGNOSIS — O4693 Antepartum hemorrhage, unspecified, third trimester: Secondary | ICD-10-CM | POA: Diagnosis present

## 2014-02-15 DIAGNOSIS — O4590 Premature separation of placenta, unspecified, unspecified trimester: Principal | ICD-10-CM | POA: Diagnosis present

## 2014-02-15 DIAGNOSIS — O4593 Premature separation of placenta, unspecified, third trimester: Secondary | ICD-10-CM

## 2014-02-15 DIAGNOSIS — O4690 Antepartum hemorrhage, unspecified, unspecified trimester: Secondary | ICD-10-CM | POA: Diagnosis present

## 2014-02-15 LAB — CBC
HCT: 29.8 % — ABNORMAL LOW (ref 36.0–46.0)
Hemoglobin: 9.5 g/dL — ABNORMAL LOW (ref 12.0–15.0)
MCH: 24.7 pg — AB (ref 26.0–34.0)
MCHC: 31.9 g/dL (ref 30.0–36.0)
MCV: 77.4 fL — ABNORMAL LOW (ref 78.0–100.0)
PLATELETS: 163 10*3/uL (ref 150–400)
RBC: 3.85 MIL/uL — ABNORMAL LOW (ref 3.87–5.11)
RDW: 15.2 % (ref 11.5–15.5)
WBC: 8.7 10*3/uL (ref 4.0–10.5)

## 2014-02-15 MED ORDER — LACTATED RINGERS IV SOLN
INTRAVENOUS | Status: DC
  Administered 2014-02-15: 18:00:00 via INTRAVENOUS

## 2014-02-15 MED ORDER — DOCUSATE SODIUM 100 MG PO CAPS
100.0000 mg | ORAL_CAPSULE | Freq: Every day | ORAL | Status: DC
  Administered 2014-02-18: 100 mg via ORAL
  Filled 2014-02-15 (×4): qty 1

## 2014-02-15 MED ORDER — SODIUM CHLORIDE 0.9 % IJ SOLN
3.0000 mL | Freq: Two times a day (BID) | INTRAMUSCULAR | Status: DC
  Administered 2014-02-15 – 2014-02-19 (×7): 3 mL via INTRAVENOUS

## 2014-02-15 MED ORDER — SODIUM CHLORIDE 0.9 % IJ SOLN: 3.0000 mL | INTRAMUSCULAR | Status: DC | PRN

## 2014-02-15 MED ORDER — CALCIUM CARBONATE ANTACID 500 MG PO CHEW
2.0000 | CHEWABLE_TABLET | ORAL | Status: DC | PRN
  Filled 2014-02-15: qty 2

## 2014-02-15 MED ORDER — COMPLETENATE 29-1 MG PO CHEW
1.0000 | CHEWABLE_TABLET | Freq: Every day | ORAL | Status: DC
  Administered 2014-02-15: 1 via ORAL
  Filled 2014-02-15 (×4): qty 1

## 2014-02-15 MED ORDER — PRENATAL MULTIVITAMIN CH
1.0000 | ORAL_TABLET | Freq: Every day | ORAL | Status: DC
  Filled 2014-02-15: qty 1

## 2014-02-15 MED ORDER — ZOLPIDEM TARTRATE 5 MG PO TABS: 5.0000 mg | ORAL_TABLET | Freq: Every evening | ORAL | Status: DC | PRN

## 2014-02-15 MED ORDER — SODIUM CHLORIDE 0.9 % IV SOLN: 250.0000 mL | INTRAVENOUS | Status: DC | PRN

## 2014-02-15 MED ORDER — ACETAMINOPHEN 325 MG PO TABS
650.0000 mg | ORAL_TABLET | ORAL | Status: DC | PRN
Start: 2014-02-15 — End: 2014-02-22

## 2014-02-15 NOTE — H&P (Signed)
Alicia Butler is a 33 y.o. female G3P1011 at 3835+ with vaginal bleeding.  After voiding this AM, gush of blood with contunued VB.  +FM, no LOF,occ ctx. Has had several episodes of third trimester VB.  Has previously received BMZ for lung development.  Isolated EIF on US Maternal Medical History:  Reason for admission: Vaginal bleeding.   Contractions: Frequency: irregular.   Perceived severity is mild.    Fetal activity: Perceived fetal activity is normal.    Prenatal complications: Bleeding.   Prenatal Complications - Diabetes: none.    OB History   Grav Para Term Preterm Abortions TAB SAB Ect Mult Living   2 1 1       1     G1 TAB, G2 36wk SVD, female 5#13, G3 present partial abruption?. + abn pap, h/o Chl and Trich  Past Medical History  Diagnosis Date  . Asthma   ?CHTN  Past Surgical History  Procedure Laterality Date  . No past surgeries    D&C  Family History: family history includes Asthma in her father; Cancer in her paternal aunt; Diabetes in her maternal grandmother; Heart disease in her paternal uncle; Hypertension in her father. Social History:  reports that she has never smoked. She does not have any smokeless tobacco history on file. She reports that she drinks alcohol. 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   HENT: Negative.   Eyes: Negative.   Respiratory: Negative.    Cardiovascular: Negative.   Gastrointestinal: Negative.   Genitourinary: Negative.   Musculoskeletal: Negative.   Skin: Negative.   Neurological: Negative.   Psychiatric/Behavioral: Negative.     Dilation: Fingertip (outer os) Exam by:: J.Ethier,PA Blood pressure 122/79, pulse 110, temperature 98.1 F (36.7 C), temperature source Oral, resp. rate 20, height 5\' 7"  (1.702 m), weight 116.574 kg (257 lb), last menstrual period 03/21/2013. Maternal Exam:  Uterine Assessment: Contraction frequency is irregular.   Abdomen: Fundal height is appropriate for gestation.   Fetal presentation: vertex  Introitus: Normal vulva. Normal vagina.  Pelvis: adequate for delivery.      Physical Exam  Constitutional: She is oriented to person, place, and time. She appears well-developed and well-nourished.  HENT:  Head: Normocephalic and atraumatic.  Cardiovascular: Normal rate and regular rhythm.   Respiratory: Effort normal and breath sounds normal. No respiratory distress.  GI: Soft. Bowel sounds are normal. She exhibits no distension. There is no tenderness.  Musculoskeletal: Normal range of motion.  Neurological: She is alert and oriented to person, place, and time.  Skin: Skin is warm and dry.  Psychiatric: She has a normal mood and affect. Her behavior is normal.    Prenatal labs: ABO, Rh: --/--/A POS, A POS (04/22 2050) Antibody: NEG (04/22 2050) Rubella: Immune (12/19 0000) RPR:   NR HBsAg: Negative (12/19 0000)  HIV: Non-reactive (04/02 0000)  GBS:   unknown  Hgb 10.9/Ur Cx neg/GC neg/Chl neg/ Varicella nonimmune/Hgb electro - Beta thalasemia minor/Pap WNL HR HPV  neg/First Tri Screen WNL/AFP WNL/glucola 102/Plt 287K  Dated by LMP cw First Tri US US nl AFI, EDC 03/19/14, breech, ant plac, anterior fibroid  Assessment/Plan: 32yo G3P1011 at 35+ with VB admitted for observation S/p BMZ Will get US   Bovard-Stuckert, Jody 02/15/2014, 8:33 PM

## 2014-02-15 NOTE — Progress Notes (Signed)
Ur chart review completed.  

## 2014-02-15 NOTE — MAU Provider Note (Signed)
  History     CSN: [id number]  Arrival date and time: 02/15/14 1108   First Provider Initiated Contact with Patient 02/15/14 1142      Chief Complaint  Patient presents with  . Vaginal Bleeding   HPI [name] is a 33 y.o. G2P1001 at 8345w3d who presents to MAU today with new onset vaginal bleeding. The patient has had multiple other episodes of bleeding during this pregnancy and has been diagnosed with placental abruption. She states that this bleeding has been lighter than previous episodes. She denies contractions or abdominal pain. She reports good fetal movement.   OB History   Grav Para Term Preterm Abortions TAB SAB Ect Mult Living   2 1 1       1       Past Medical History  Diagnosis Date  . Asthma     Past Surgical History  Procedure Laterality Date  . No past surgeries      Family History  Problem Relation Age of Onset  . Asthma Father   . Hypertension Father   . Cancer Paternal Aunt   . Heart disease Paternal Uncle   . Diabetes Maternal Grandmother     History  Substance Use Topics  . Smoking status: Never Smoker   . Smokeless tobacco: Not on file  . Alcohol Use: Yes     Comment: social    Allergies:  Allergies  Allergen Reactions  . Iodine Anaphylaxis  . Shellfish Allergy Anaphylaxis and Hives  . Penicillins Hives    Prescriptions prior to admission  Medication Sig Dispense Refill  . acetaminophen (TYLENOL) 500 MG tablet Take 500 mg by mouth every 6 (six) hours as needed for moderate pain.      Marland Kitchen. albuterol (PROVENTIL HFA;VENTOLIN HFA) 108 (90 BASE) MCG/ACT inhaler Inhale 2 puffs into the lungs every 6 (six) hours as needed for wheezing or shortness of breath.      . Prenatal Vit-Min-FA-Fish Oil ([employer] PRENATAL GUMMY PO) Take 2 tablets by mouth daily.        Review of Systems  Constitutional: Negative for malaise/fatigue.  Gastrointestinal: Negative for abdominal pain.  Genitourinary:       + vaginal bleeding  Neurological: Negative  for dizziness, loss of consciousness and weakness.   Physical Exam   Blood pressure 131/72, pulse 117, temperature 98.3 F (36.8 C), temperature source Oral, resp. rate 18, last menstrual period 03/21/2013.  Physical Exam  Constitutional: She is oriented to person, place, and time. She appears well-developed and well-nourished. No distress.  HENT:  Head: Normocephalic and atraumatic.  Cardiovascular: Normal rate, regular rhythm and normal heart sounds.   Respiratory: Effort normal and breath sounds normal. No respiratory distress.  GI: Soft. She exhibits no distension and no mass. There is no tenderness. There is no rebound and no guarding.  Genitourinary: There is bleeding (small amount of blood in the vagina oozing from the cervix) around the vagina. No vaginal discharge found.  Neurological: She is alert and oriented to person, place, and time.  Skin: Skin is warm and dry. No erythema.  Psychiatric: She has a normal mood and affect.    Fetal Monitoring: Baseline: 140 bpm, moderate variability, + accelerations, + decelarations Contractions: occasional, irregular  MAU Course  Procedures None  MDM Discussed with Dr. Ellyn HackBovard. Admit to antenatal  Assessment and Plan  A: SIUP at 8845w3d Placental abruption  P: Admit to antenatal  Freddi StarrJulie N Ethier, PA-C  02/15/2014, 11:42 AM

## 2014-02-15 NOTE — MAU Note (Signed)
Pt reports this is the third time she has had bleeding like this. Told she has a partial placenta abruption. Denies any pain or cramping at this time.

## 2014-02-16 MED ORDER — TETANUS-DIPHTH-ACELL PERTUSSIS 5-2.5-18.5 LF-MCG/0.5 IM SUSP
0.5000 mL | Freq: Once | INTRAMUSCULAR | Status: AC
  Administered 2014-02-16: 0.5 mL via INTRAMUSCULAR
  Filled 2014-02-16: qty 0.5

## 2014-02-16 NOTE — Progress Notes (Signed)
Patient ID: Alicia Butler, female   DOB: 06/23/1981, 33 y.o.   MRN: [id number]  No c/o's. No active VB.  +FM, no LOF, no VB, occ, rare ctx  AFVSS gen NAD Abd soft, NT FHTs 135-140, category 1 toco rare  32yo G3P1011 at 35+ with heavy VB yesterday, no obiovus abruption on US, but likely partial abruption Will monitor for now Reviewed US, good growth, vtx, ant plac, fibroid SLIV Decrease monitoring to TID NST

## 2014-02-16 NOTE — Progress Notes (Signed)
No active bleeding, some dark discharge FHT-reactive She inquires about Tdap, did not get in the office, will order now

## 2014-02-17 MED ORDER — PRENATAL MULTIVITAMIN CH
1.0000 | ORAL_TABLET | Freq: Every day | ORAL | Status: DC
  Administered 2014-02-17 – 2014-02-21 (×5): 1 via ORAL
  Filled 2014-02-17 (×5): qty 1

## 2014-02-17 NOTE — Progress Notes (Addendum)
Patient ID: Kinnie FeilLatasha Butler, female   DOB: 05/12/1981, 33 y.o.   MRN: [id number] 32yo [id number] with ? Partial abruption, admitted after bleed  No c/o's.  +FM, no LOF, sm brown d/c, this AM some bright red clots, occ ctx  AFVSS gen NAD Abd soft, gravid, NT FHTs 130-150, category 1 toco occ, irr  Continue current management Observation

## 2014-02-17 NOTE — Progress Notes (Signed)
Patient ID: Alicia FeilLatasha Butler, female   DOB: 10/03/1980, 33 y.o.   MRN: [id number] No further VB throughout day, brown spotting only  FHR reassuring  35 5/7 continue current care

## 2014-02-18 NOTE — Progress Notes (Signed)
Patient ID: Alicia Butler, female   DOB: 03/04/1981, 33 y.o.   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  In AM of 6/19 had a smaller amount of BRB, since then only scant brown spotting Great FM, NST's are Category 1 Abdomen soft, no pain  Pt asking when she can go home. We discussed MFM recommendation of in-house monitoring for 5-7 days of no VB before d/c We also discussed that if she continues to bleed intermittently she may need early delivery at 37 weeks,  But hopefully will stop and make it to 39 weeks.   We discussed IOL vs C/S if bleeding was heavy enough to threaten her or stress the baby.

## 2014-02-19 NOTE — Progress Notes (Signed)
Patient ID: Alicia Butler, female   DOB: 12/10/1980, 33 y.o.   MRN: [id number] Pt 36 0/7 weeks probable chronic partial abruption  No VB since Friday AM, just brown spotting No pain, Good FM  afeb vss  Abd soft  FHR tracings Category 1 Remaining in-house until 5-7 days of no bleeding Saline lock expires today, will not replace--has good IV Access Type and Screen good until midnight, if stable will not renew

## 2014-02-20 LAB — TYPE AND SCREEN
ABO/RH(D): A POS
ABO/RH(D): A POS
ANTIBODY SCREEN: NEGATIVE
ANTIBODY SCREEN: NEGATIVE

## 2014-02-20 NOTE — Progress Notes (Signed)
Ur chart review completed.  

## 2014-02-20 NOTE — Progress Notes (Signed)
Patient ID: Alicia Butler, female   DOB: 02/17/1981, 33 y.o.   MRN: [id number]  32yo G3P1011 at 36+ with chronic partial abruption  +FM, no LOF, only small old blood, no active bleeding, occ ctx  AFVSS gen NAD Abd soft, gravid FHT 140 category 1 toco occ  Doing well. Continue current management

## 2014-02-21 ENCOUNTER — Inpatient Hospital Stay (HOSPITAL_COMMUNITY): Payer: BC Managed Care – PPO

## 2014-02-21 LAB — GROUP B STREP BY PCR: Group B strep by PCR: NEGATIVE

## 2014-02-21 MED ORDER — ONDANSETRON HCL 4 MG PO TABS
4.0000 mg | ORAL_TABLET | Freq: Three times a day (TID) | ORAL | Status: DC | PRN
  Administered 2014-02-22: 4 mg via ORAL
  Filled 2014-02-21: qty 1

## 2014-02-21 NOTE — Progress Notes (Signed)
Patient ID: Kinnie FeilLatasha Rule, female   DOB: 11/18/1980, 33 y.o.   MRN: [id number]  32yo G3P1011 at 36+2 with likely partial abruption  +FM, no LOF, no active VB - only small dark blood  AFVSS gen NAD Abd soft, gravid  FHTs 130-140, category 1 toco occ  Continue current mgmt, d/w MFM delivery plan

## 2014-02-22 NOTE — Progress Notes (Signed)
Pt. Is stable and ready to be discharged home. All discharge instructions reviewed and given to patient. All questions answered. Pt. Will follow up with office tomorrow. Pt. Wheeled out via wheelchair to friend's car. All belongings with the patient.

## 2014-02-22 NOTE — Progress Notes (Signed)
Patient ID: Alicia Butler, female   DOB: 02/19/1981, 33 y.o.   MRN: [id number]  32yo G3P1011 at 36+ with chronic partial abruption - no VB for 5 days.  D/c to home IOL 03/02/14  No c/o's.  +FM, no LOF, no active VB, occ ctx  AFVSS gen NAD Abd soft,gravid, NT  FHT 140's R toco occ  Partial abruption, iol 03/02/14 - per pt request, per MFM reccs - scheduled 7:00am.  NST tomorrow and appt.  NST Tuesday.  Bleeding precautions

## 2014-02-22 NOTE — Discharge Summary (Signed)
Obstetric Discharge Summary Reason for Admission: VB - partial abruption Prenatal Procedures: NST and US Intrapartum Procedures: N/A Postpartum Procedures: N/A Complications-Operative and Postpartum: N/A Hemoglobin  Date Value Ref Range Status  02/15/2014 9.5* 12.0 - 15.0 g/dL Final     HCT  Date Value Ref Range Status  02/15/2014 29.8* 36.0 - 46.0 % Final    Physical Exam:  General: alert and no distress Abd NT, gravid FHTs 140's category 1  Discharge Diagnoses: Antepartum bleeding  Discharge Information: Date: 02/22/2014 Activity: pelvic rest Diet: routine Medications: PNV Condition: stable Instructions: labor and bleeding precautions Discharge to: home Follow-up Information   Follow up with Bovard-Stuckert, Augusto GambleJody, MD In 1 day. (As scheduled - NST)    Specialty:  Obstetrics and Gynecology   Contact information:   [address] [place] [address] [phone]       Bovard-Stuckert, Augusto GambleJody 02/22/2014, 8:35 AM

## 2014-02-28 ENCOUNTER — Encounter (HOSPITAL_COMMUNITY): Payer: Self-pay | Admitting: *Deleted

## 2014-02-28 ENCOUNTER — Telehealth (HOSPITAL_COMMUNITY): Payer: Self-pay | Admitting: *Deleted

## 2014-02-28 NOTE — Telephone Encounter (Signed)
Preadmission screen  

## 2014-03-01 MED ORDER — ALBUTEROL SULFATE HFA 108 (90 BASE) MCG/ACT IN AERS: 2.0000 | INHALATION_SPRAY | Freq: Four times a day (QID) | RESPIRATORY_TRACT | Status: DC | PRN

## 2014-03-01 NOTE — H&P (Signed)
Alicia Butler is a 33 y.o. female G3P1011 at 37+ for IOL given 3rd trimester VB, likely partial abruption.  D/W MFM, pt unable to have iol until 03/02/14. Pt had several bleeds, admitted at 35 wk.  Received BMZ in 5/15. Multiple ultraounds have not revealed abruption.     Maternal Medical History:  Contractions: Frequency: irregular.   Perceived severity is moderate.    Fetal activity: Perceived fetal activity is normal.    Prenatal complications: Bleeding.   Prenatal Complications - Diabetes: none.    OB History   Grav Para Term Preterm Abortions TAB SAB Ect Mult Living   3 1 0 1 1  1   1     G1 TAB, G2 36wk IOL PIH/oligo, G3 present; + abn pap, nl colpo, nl pap w/ pregnancy.  H/o Chl and trich  Past Medical History  Diagnosis Date  . Asthma   several fractures  Past Surgical History  Procedure Laterality Date  . No past surgeries     Family History: family history includes Asthma in her father; Cancer in her paternal aunt; Diabetes in her maternal grandmother and mother; Heart disease in her paternal uncle; Hypertension in her father. Social History:  reports that she has never smoked. She has never used smokeless tobacco. She reports that she drinks alcohol. 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   HENT: Negative.   Eyes: Negative.   Respiratory: Negative.   Cardiovascular: Negative.   Gastrointestinal:  Negative.   Genitourinary: Negative.   Musculoskeletal: Negative.   Skin: Negative.   Neurological: Negative.   Psychiatric/Behavioral: Negative.       Last menstrual period 03/21/2013. Maternal Exam:  Uterine Assessment: Contraction strength is moderate.  Abdomen: Fundal height is appropriate for gestation.   Estimated fetal weight is 7#.   Fetal presentation: vertex  Introitus: Normal vulva. Normal vagina.  Pelvis: adequate for delivery.   Cervix: Cervix evaluated by digital exam.     Physical Exam  Constitutional: She is oriented to person, place, and time. She appears well-developed and well-nourished.  HENT:  Head: Normocephalic and atraumatic.  Cardiovascular: Normal rate and regular rhythm.   Respiratory: Effort normal and breath sounds normal. No respiratory distress. She has no wheezes.  GI: Soft. Bowel sounds are normal. She exhibits no distension. There is no tenderness.  Musculoskeletal: Normal range of motion.  Neurological: She is alert and oriented to person, place, and time.  Skin: Skin is warm and dry.  Psychiatric: She has a normal mood and affect. Her behavior is normal.    Prenatal labs: ABO, Rh: --/--/A POS (06/22 1623) Antibody: NEG (06/22 1623) Rubella: Immune (12/19 0000) RPR:   NR HBsAg: Negative (12/19 0000)  HIV: Non-reactive (04/02 0000)  GBS:   negative  Hgb 10.9/Ur Cx neg/Chl neg/ GC neg/Varicella Nonimmune/Plt 287K/Hgb electro - Bthal minor/pap WNL HR HPV neg/First Tri and AFP WNL/glucola 102  Flu 10/14  US EDC 7/19 cwd Nl AFI, br, ant plac, 2.5cm ant plac,  nl anat EFW 6/17 6#10  Assessment/Plan: 32yo G3P1011 at 37+ for IOL given likely partial abruption GBBS neg - no prophylaxis AROM and pitocin Expect SVD   Bovard-Stuckert, Ozie Dimaria 03/01/2014, 8:42 PM

## 2014-03-02 ENCOUNTER — Encounter (HOSPITAL_COMMUNITY): Payer: Self-pay

## 2014-03-02 ENCOUNTER — Inpatient Hospital Stay (HOSPITAL_COMMUNITY)
Admission: AD | Admit: 2014-03-02 | Discharge: 2014-03-04 | DRG: 774 | Disposition: A | Discharge status: Home / Self Care | Payer: BC Managed Care – PPO | Source: Ambulatory Visit | Attending: Obstetrics and Gynecology | Admitting: Obstetrics and Gynecology

## 2014-03-02 ENCOUNTER — Encounter (HOSPITAL_COMMUNITY): Payer: BC Managed Care – PPO | Admitting: Anesthesiology

## 2014-03-02 ENCOUNTER — Inpatient Hospital Stay (HOSPITAL_COMMUNITY): Payer: BC Managed Care – PPO | Admitting: Anesthesiology

## 2014-03-02 DIAGNOSIS — Z6841 Body Mass Index (BMI) 40.0 and over, adult: Secondary | ICD-10-CM

## 2014-03-02 DIAGNOSIS — Z833 Family history of diabetes mellitus: Secondary | ICD-10-CM

## 2014-03-02 DIAGNOSIS — O99214 Obesity complicating childbirth: Secondary | ICD-10-CM

## 2014-03-02 DIAGNOSIS — Z8249 Family history of ischemic heart disease and other diseases of the circulatory system: Secondary | ICD-10-CM

## 2014-03-02 DIAGNOSIS — O4693 Antepartum hemorrhage, unspecified, third trimester: Secondary | ICD-10-CM | POA: Diagnosis present

## 2014-03-02 DIAGNOSIS — O4690 Antepartum hemorrhage, unspecified, unspecified trimester: Secondary | ICD-10-CM | POA: Diagnosis present

## 2014-03-02 DIAGNOSIS — E669 Obesity, unspecified: Secondary | ICD-10-CM | POA: Diagnosis present

## 2014-03-02 LAB — TYPE AND SCREEN
ABO/RH(D): A POS
Antibody Screen: NEGATIVE

## 2014-03-02 LAB — CBC
HCT: 32.1 % — ABNORMAL LOW (ref 36.0–46.0)
HEMOGLOBIN: 10.3 g/dL — AB (ref 12.0–15.0)
MCH: 24.8 pg — ABNORMAL LOW (ref 26.0–34.0)
MCHC: 32.1 g/dL (ref 30.0–36.0)
MCV: 77.3 fL — AB (ref 78.0–100.0)
Platelets: 183 10*3/uL (ref 150–400)
RBC: 4.15 MIL/uL (ref 3.87–5.11)
RDW: 15.9 % — ABNORMAL HIGH (ref 11.5–15.5)
WBC: 7.6 10*3/uL (ref 4.0–10.5)

## 2014-03-02 LAB — OB RESULTS CONSOLE GBS: STREP GROUP B AG: NEGATIVE

## 2014-03-02 LAB — RPR

## 2014-03-02 MED ORDER — TERBUTALINE SULFATE 1 MG/ML IJ SOLN: 0.2500 mg | Freq: Once | INTRAMUSCULAR | Status: DC | PRN

## 2014-03-02 MED ORDER — LACTATED RINGERS IV SOLN: INTRAVENOUS | Status: DC

## 2014-03-02 MED ORDER — EPHEDRINE 5 MG/ML INJ
10.0000 mg | INTRAVENOUS | Status: DC | PRN
  Filled 2014-03-02: qty 4
  Filled 2014-03-02: qty 2

## 2014-03-02 MED ORDER — LACTATED RINGERS IV SOLN
500.0000 mL | INTRAVENOUS | Status: DC | PRN
Start: 2014-03-02 — End: 2014-03-02

## 2014-03-02 MED ORDER — PRENATAL MULTIVITAMIN CH
1.0000 | ORAL_TABLET | Freq: Every day | ORAL | Status: DC
  Administered 2014-03-04: 1 via ORAL
  Filled 2014-03-02: qty 1

## 2014-03-02 MED ORDER — FENTANYL 2.5 MCG/ML BUPIVACAINE 1/10 % EPIDURAL INFUSION (WH - ANES)
14.0000 mL/h | INTRAMUSCULAR | Status: DC | PRN
  Administered 2014-03-02: 14 mL/h via EPIDURAL
  Filled 2014-03-02: qty 125

## 2014-03-02 MED ORDER — ONDANSETRON HCL 4 MG PO TABS: 4.0000 mg | ORAL_TABLET | ORAL | Status: DC | PRN

## 2014-03-02 MED ORDER — BUTORPHANOL TARTRATE 1 MG/ML IJ SOLN: 1.0000 mg | INTRAMUSCULAR | Status: DC | PRN

## 2014-03-02 MED ORDER — OXYTOCIN 40 UNITS IN LACTATED RINGERS INFUSION - SIMPLE MED
1.0000 m[IU]/min | INTRAVENOUS | Status: DC
  Administered 2014-03-02: 2 m[IU]/min via INTRAVENOUS
  Filled 2014-03-02: qty 1000

## 2014-03-02 MED ORDER — SIMETHICONE 80 MG PO CHEW: 80.0000 mg | CHEWABLE_TABLET | ORAL | Status: DC | PRN

## 2014-03-02 MED ORDER — CITRIC ACID-SODIUM CITRATE 334-500 MG/5ML PO SOLN: 30.0000 mL | ORAL | Status: DC | PRN

## 2014-03-02 MED ORDER — IBUPROFEN 600 MG PO TABS
600.0000 mg | ORAL_TABLET | Freq: Four times a day (QID) | ORAL | Status: DC
  Administered 2014-03-02 – 2014-03-04 (×7): 600 mg via ORAL
  Filled 2014-03-02 (×9): qty 1

## 2014-03-02 MED ORDER — PHENYLEPHRINE 40 MCG/ML (10ML) SYRINGE FOR IV PUSH (FOR BLOOD PRESSURE SUPPORT)
80.0000 ug | PREFILLED_SYRINGE | INTRAVENOUS | Status: DC | PRN
  Filled 2014-03-02: qty 10
  Filled 2014-03-02: qty 2

## 2014-03-02 MED ORDER — DIBUCAINE 1 % RE OINT
1.0000 "application " | TOPICAL_OINTMENT | RECTAL | Status: DC | PRN
  Administered 2014-03-04: 1 via RECTAL
  Filled 2014-03-02: qty 28

## 2014-03-02 MED ORDER — ACETAMINOPHEN 325 MG PO TABS: 650.0000 mg | ORAL_TABLET | ORAL | Status: DC | PRN

## 2014-03-02 MED ORDER — IBUPROFEN 600 MG PO TABS: 600.0000 mg | ORAL_TABLET | Freq: Four times a day (QID) | ORAL | Status: DC | PRN

## 2014-03-02 MED ORDER — TETANUS-DIPHTH-ACELL PERTUSSIS 5-2.5-18.5 LF-MCG/0.5 IM SUSP: 0.5000 mL | Freq: Once | INTRAMUSCULAR | Status: DC

## 2014-03-02 MED ORDER — BENZOCAINE-MENTHOL 20-0.5 % EX AERO
1.0000 "application " | INHALATION_SPRAY | CUTANEOUS | Status: DC | PRN
  Administered 2014-03-02: 1 via TOPICAL
  Filled 2014-03-02 (×2): qty 56

## 2014-03-02 MED ORDER — SENNOSIDES-DOCUSATE SODIUM 8.6-50 MG PO TABS
2.0000 | ORAL_TABLET | ORAL | Status: DC
  Filled 2014-03-02 (×2): qty 2

## 2014-03-02 MED ORDER — LIDOCAINE HCL (PF) 1 % IJ SOLN
30.0000 mL | INTRAMUSCULAR | Status: DC | PRN
  Filled 2014-03-02: qty 30

## 2014-03-02 MED ORDER — DIPHENHYDRAMINE HCL 50 MG/ML IJ SOLN: 12.5000 mg | INTRAMUSCULAR | Status: DC | PRN

## 2014-03-02 MED ORDER — FENTANYL 2.5 MCG/ML BUPIVACAINE 1/10 % EPIDURAL INFUSION (WH - ANES)
INTRAMUSCULAR | Status: DC | PRN
  Administered 2014-03-02: 14 mL/h via EPIDURAL

## 2014-03-02 MED ORDER — WITCH HAZEL-GLYCERIN EX PADS: 1.0000 "application " | MEDICATED_PAD | CUTANEOUS | Status: DC | PRN

## 2014-03-02 MED ORDER — LACTATED RINGERS IV SOLN
500.0000 mL | Freq: Once | INTRAVENOUS | Status: AC
  Administered 2014-03-02: 500 mL via INTRAVENOUS

## 2014-03-02 MED ORDER — OXYTOCIN BOLUS FROM INFUSION: 500.0000 mL | INTRAVENOUS | Status: DC

## 2014-03-02 MED ORDER — OXYCODONE-ACETAMINOPHEN 5-325 MG PO TABS: 1.0000 | ORAL_TABLET | ORAL | Status: DC | PRN

## 2014-03-02 MED ORDER — DIPHENHYDRAMINE HCL 25 MG PO CAPS
25.0000 mg | ORAL_CAPSULE | Freq: Four times a day (QID) | ORAL | Status: DC | PRN
Start: 2014-03-02 — End: 2014-03-04

## 2014-03-02 MED ORDER — ONDANSETRON HCL 4 MG/2ML IJ SOLN: 4.0000 mg | Freq: Four times a day (QID) | INTRAMUSCULAR | Status: DC | PRN

## 2014-03-02 MED ORDER — LANOLIN HYDROUS EX OINT: TOPICAL_OINTMENT | CUTANEOUS | Status: DC | PRN

## 2014-03-02 MED ORDER — LIDOCAINE HCL (PF) 1 % IJ SOLN
INTRAMUSCULAR | Status: DC | PRN
  Administered 2014-03-02 (×2): 8 mL

## 2014-03-02 MED ORDER — ONDANSETRON HCL 4 MG/2ML IJ SOLN: 4.0000 mg | INTRAMUSCULAR | Status: DC | PRN

## 2014-03-02 MED ORDER — EPHEDRINE 5 MG/ML INJ
10.0000 mg | INTRAVENOUS | Status: DC | PRN
  Filled 2014-03-02: qty 2

## 2014-03-02 MED ORDER — PHENYLEPHRINE 40 MCG/ML (10ML) SYRINGE FOR IV PUSH (FOR BLOOD PRESSURE SUPPORT)
80.0000 ug | PREFILLED_SYRINGE | INTRAVENOUS | Status: DC | PRN
  Filled 2014-03-02: qty 2

## 2014-03-02 MED ORDER — ZOLPIDEM TARTRATE 5 MG PO TABS: 5.0000 mg | ORAL_TABLET | Freq: Every evening | ORAL | Status: DC | PRN

## 2014-03-02 MED ORDER — OXYTOCIN 40 UNITS IN LACTATED RINGERS INFUSION - SIMPLE MED
62.5000 mL/h | INTRAVENOUS | Status: DC
  Administered 2014-03-02: 62.5 mL/h via INTRAVENOUS

## 2014-03-02 NOTE — Progress Notes (Signed)
Patient ID: Kinnie FeilLatasha Butler, female   DOB: 01/29/1981, 33 y.o.   MRN: [id number]  H&P reviewed, no changes  AFVSS gen NAD FHTs 140's category 1, moderate variability toco irr  SVE 1/50/-2 per RN Will AROM after Pitocin at lunch time

## 2014-03-02 NOTE — Anesthesia Preprocedure Evaluation (Signed)
Anesthesia Evaluation  Patient identified by MRN, date of birth, ID band Patient awake    Reviewed: Allergy & Precautions, H&P , NPO status , Patient's Chart, lab work & pertinent test results  Airway Mallampati: II TM Distance: >3 FB Neck ROM: full    Dental no notable dental hx.    Pulmonary    Pulmonary exam normal       Cardiovascular negative cardio ROS      Neuro/Psych negative neurological ROS  negative psych ROS   GI/Hepatic negative GI ROS, Neg liver ROS,   Endo/Other  Morbid obesity  Renal/GU negative Renal ROS     Musculoskeletal   Abdominal (+) + obese,   Peds  Hematology negative hematology ROS (+)   Anesthesia Other Findings   Reproductive/Obstetrics (+) Pregnancy                           Anesthesia Physical Anesthesia Plan  ASA: III  Anesthesia Plan: Epidural   Post-op Pain Management:    Induction:   Airway Management Planned:   Additional Equipment:   Intra-op Plan:   Post-operative Plan:   Informed Consent: I have reviewed the patients History and Physical, chart, labs and discussed the procedure including the risks, benefits and alternatives for the proposed anesthesia with the patient or authorized representative who has indicated his/her understanding and acceptance.     Plan Discussed with:   Anesthesia Plan Comments:         Anesthesia Quick Evaluation  

## 2014-03-02 NOTE — Anesthesia Procedure Notes (Signed)
Epidural Patient location during procedure: OB Start time: 03/02/2014 11:48 AM End time: 03/02/2014 11:52 AM  Staffing Anesthesiologist: Leilani AbleHATCHETT, Raela Bohl Performed by: anesthesiologist   Preanesthetic Checklist Completed: patient identified, surgical consent, pre-op evaluation, timeout performed, IV checked, risks and benefits discussed and monitors and equipment checked  Epidural Patient position: sitting Prep: site prepped and draped and DuraPrep Patient monitoring: continuous pulse ox and blood pressure Approach: midline Location: L3-L4 Injection technique: LOR air  Needle:  Needle type: Tuohy  Needle gauge: 17 G Needle length: 9 cm and 9 Needle insertion depth: 7 cm Catheter type: closed end flexible Catheter size: 19 Gauge Catheter at skin depth: 12 cm Test dose: negative and Other  Assessment Sensory level: T9 Events: blood not aspirated, injection not painful, no injection resistance, negative IV test and no paresthesia  Additional Notes Reason for block:procedure for pain

## 2014-03-03 LAB — CBC
HCT: 28.8 % — ABNORMAL LOW (ref 36.0–46.0)
Hemoglobin: 9.1 g/dL — ABNORMAL LOW (ref 12.0–15.0)
MCH: 24.7 pg — AB (ref 26.0–34.0)
MCHC: 31.6 g/dL (ref 30.0–36.0)
MCV: 78 fL (ref 78.0–100.0)
PLATELETS: 154 10*3/uL (ref 150–400)
RBC: 3.69 MIL/uL — ABNORMAL LOW (ref 3.87–5.11)
RDW: 15.6 % — ABNORMAL HIGH (ref 11.5–15.5)
WBC: 11.9 10*3/uL — ABNORMAL HIGH (ref 4.0–10.5)

## 2014-03-03 NOTE — Anesthesia Postprocedure Evaluation (Signed)
  Anesthesia Post-op Note  Patient: Alicia FeilLatasha Pick  Procedure(s) Performed: * No procedures listed *  Patient Location: PACU and Mother/Baby  Anesthesia Type:Epidural  Level of Consciousness: awake, alert  and oriented  Airway and Oxygen Therapy: Patient Spontanous Breathing  Post-op Pain: mild  Post-op Assessment: Patient's Cardiovascular Status Stable, Respiratory Function Stable, No signs of Nausea or vomiting, Pain level controlled, No headache, No backache, No residual numbness and No residual motor weakness  Post-op Vital Signs: Reviewed and stable  Last Vitals:  Filed Vitals:   03/03/14 0500  BP: 122/79  Pulse: 73  Temp: 36.6 C  Resp: 20    Complications: No apparent anesthesia complications

## 2014-03-03 NOTE — Lactation Note (Signed)
This note was copied from the chart of Alicia Kinnie FeilLatasha Caspers. Lactation Consultation Note Initial visit at 28 hours of age.  Mom reports baby is ready to eat, but is in clothing wrapped in blankets asleep at the breast.  Encouraged mom to undress baby for STS, baby began to show early feeding cues.  Mom is able to demonstrate hand expression with colostrum visible.  Mom is able to latch baby in football hold on left breast nearly unassisted.  Baby gets a deep wide latch with vigorous sucking and jaw excursions, swallows heard.  Mom denies pain or discomfort. Baby pulled off after 15 minutes of active feeding.  Discussed cluster feedings and normal newborn behaviors.  [facility] resources given and discussed briefly.  Mom has DEBP in room, but not in use yet.  Encouraged mom to allow baby at the breast on demand and to pump as needed.  [facility] and she will assist with pump set up and instructions as needed.     Patient Name: Alicia Butler'XToday's Date: 03/03/2014 Reason for consult: Follow-up assessment;Initial assessment   Maternal Data Has patient been taught Hand Expression?: Yes Does the patient have breastfeeding experience prior to this delivery?: Yes  Feeding Feeding Type: Breast Fed Length of feed: 15 min  LATCH Score/Interventions Latch: Grasps breast easily, tongue down, lips flanged, rhythmical sucking.  Audible Swallowing: Spontaneous and intermittent  Type of Nipple: Everted at rest and after stimulation  Comfort (Breast/Nipple): Soft / non-tender     Hold (Positioning): No assistance needed to correctly position infant at breast.  LATCH Score: 10  Lactation Tools Discussed/Used Date initiated:: 03/03/14   Consult Status Consult Status: Follow-up Date: 03/04/14 Follow-up type: In-patient    Jannifer RodneyShoptaw, Florencia Zaccaro Lynn 03/03/2014, 8:03 PM

## 2014-03-03 NOTE — Progress Notes (Signed)
PPD #1 No problems Afeb, VSS Fundus firm, NT at U-1 Continue routine postpartum care 

## 2014-03-04 MED ORDER — IBUPROFEN 600 MG PO TABS: 600.0000 mg | ORAL_TABLET | Freq: Four times a day (QID) | ORAL | Status: DC

## 2014-03-04 MED ORDER — OXYCODONE-ACETAMINOPHEN 5-325 MG PO TABS: 1.0000 | ORAL_TABLET | ORAL | Status: DC | PRN

## 2014-03-04 NOTE — Discharge Summary (Signed)
Obstetric Discharge Summary Reason for Admission: induction of labor Prenatal Procedures: NST and ultrasound Intrapartum Procedures: spontaneous vaginal delivery Postpartum Procedures: none Complications-Operative and Postpartum: 1st degree perineal laceration Hemoglobin  Date Value Ref Range Status  03/03/2014 9.1* 12.0 - 15.0 g/dL Final     HCT  Date Value Ref Range Status  03/03/2014 28.8* 36.0 - 46.0 % Final    Physical Exam:  General: alert Lochia: appropriate Uterine Fundus: firm  Discharge Diagnoses: Term Pregnancy-delivered  Discharge Information: Date: 03/04/2014 Activity: pelvic rest Diet: routine Medications: Ibuprofen and Percocet Condition: stable Instructions: refer to practice specific booklet Discharge to: home Follow-up Information   Follow up with Bovard-Stuckert, Jody, MD. Schedule an appointment as soon as possible for a visit in 6 weeks.   Specialty:  Obstetrics and Gynecology   Contact information:   [address] [place] [address] [phone]       Newborn Data: Live born female  Birth Weight: 7 lb 8.1 oz (3405 g) APGAR: 9, 9  Home with mother.  Alicia Butler D 03/04/2014, 9:39 AM

## 2014-03-04 NOTE — Progress Notes (Signed)
PPD #2 Doing well Afeb, VSS Fundus firm D/c home 

## 2014-03-04 NOTE — Discharge Instructions (Signed)
As per discharge pamphlet °

## 2014-07-03 ENCOUNTER — Encounter (HOSPITAL_COMMUNITY): Payer: Self-pay

## 2014-10-16 ENCOUNTER — Ambulatory Visit: Payer: BC Managed Care – PPO | Admitting: Diagnostic Neuroimaging

## 2014-10-26 ENCOUNTER — Ambulatory Visit: Payer: BC Managed Care – PPO | Admitting: Dietician

## 2015-11-09 IMAGING — US US OB FOLLOW-UP
1 series · 12 of 28 positions shown · non-contrast
Comparison: none

[Series 1: us ob follow-up · 0.14mm/px · 12 of 47 slices shown]
[im 2/47]
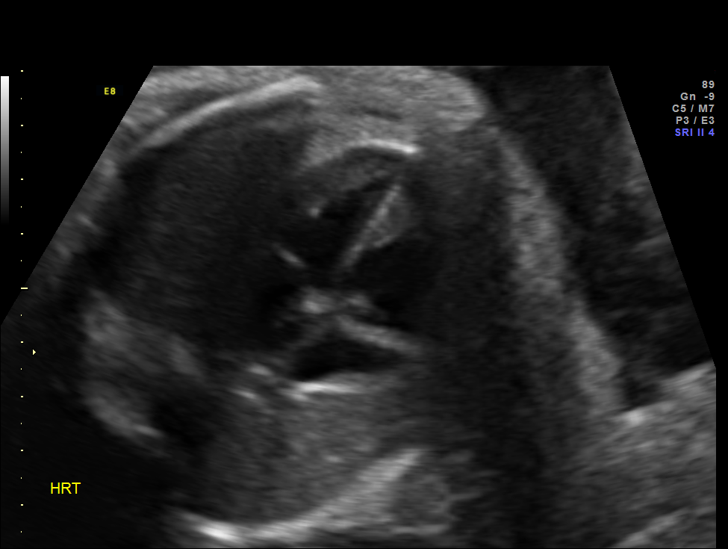
[im 6/47]
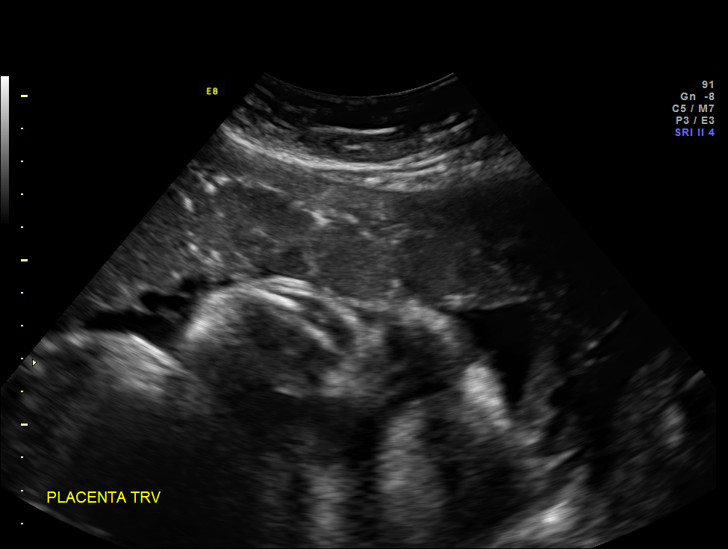
[im 9/47]
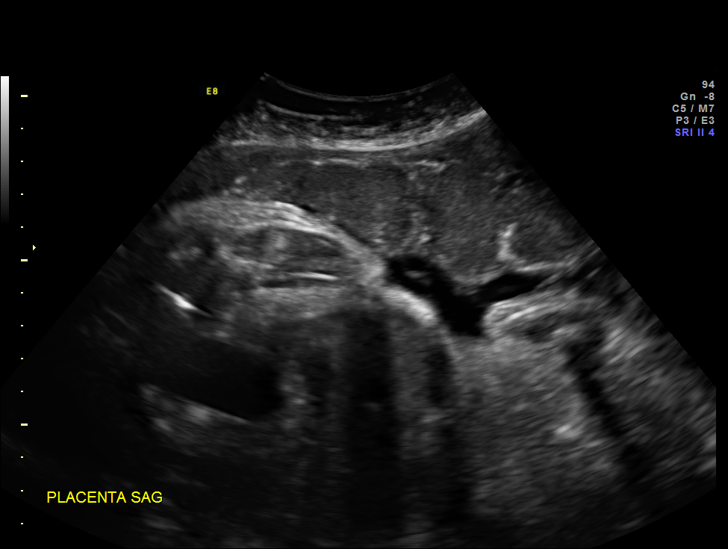
[im 14/47]
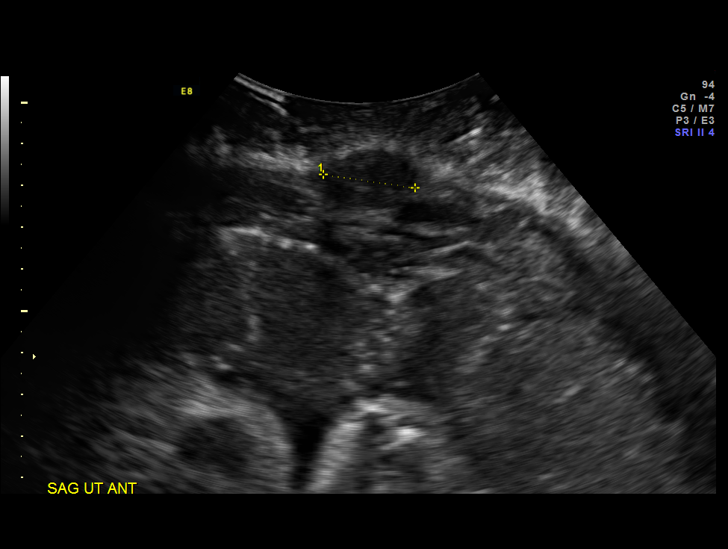
[im 18/47]
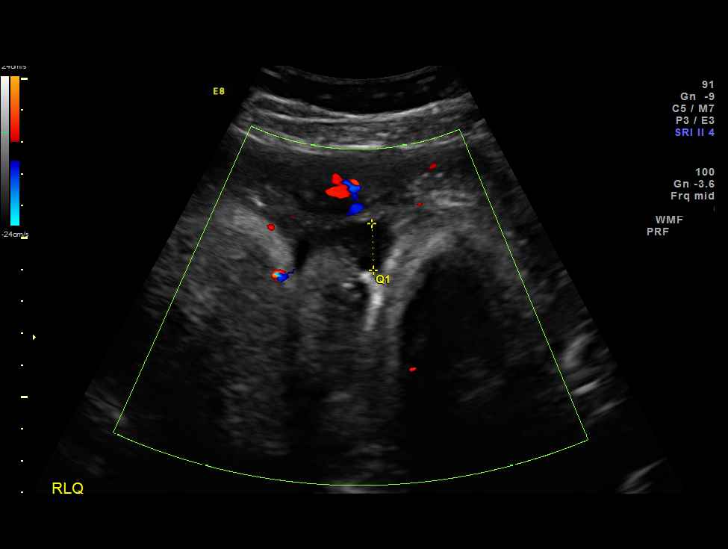
[im 21/47]
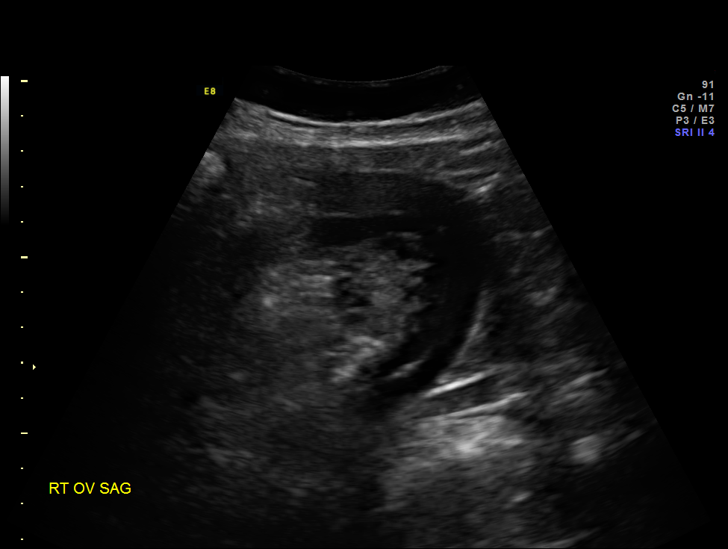
[im 26/47]
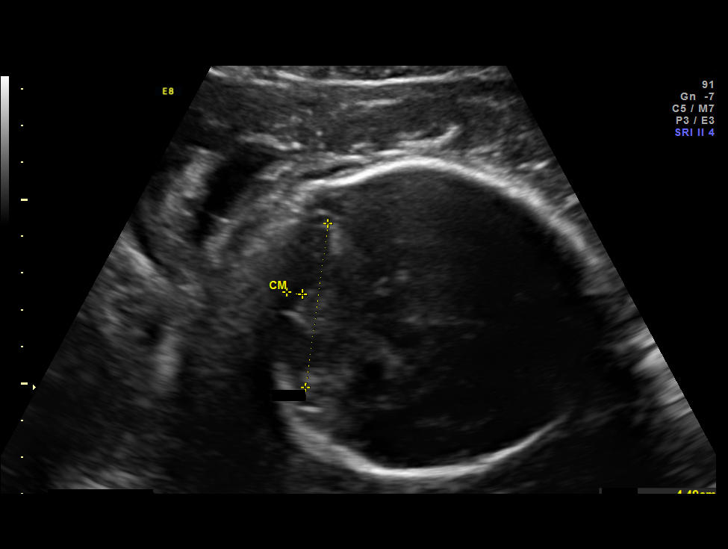
[im 29/47]
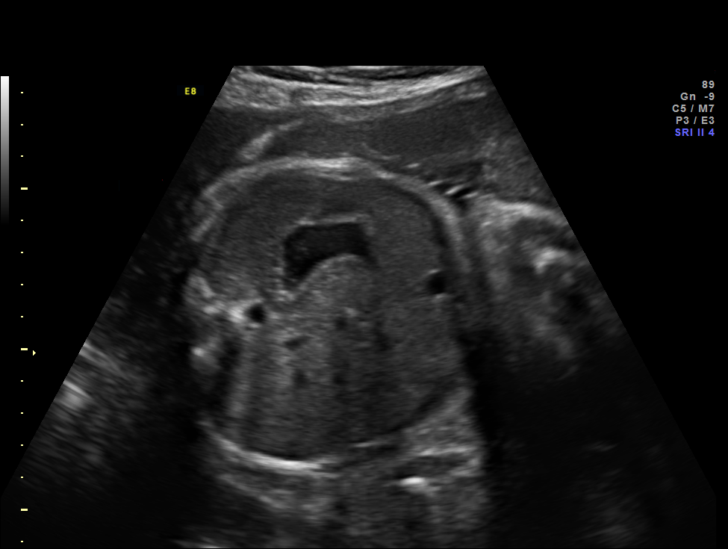
[im 33/47]
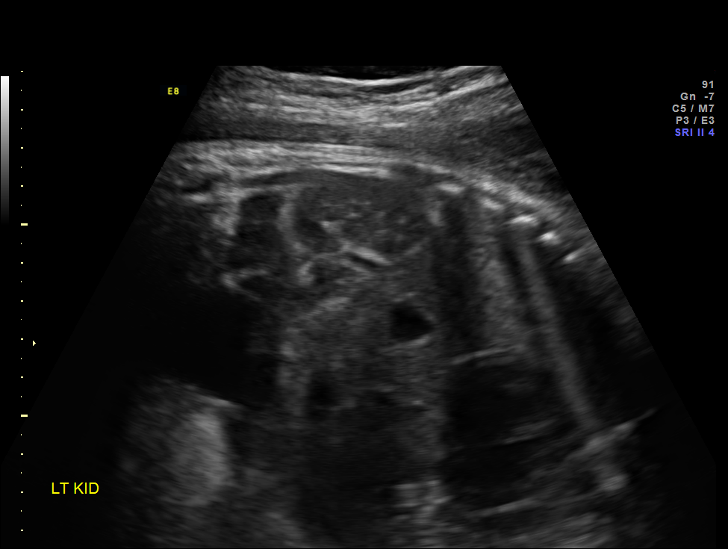
[im 38/47]
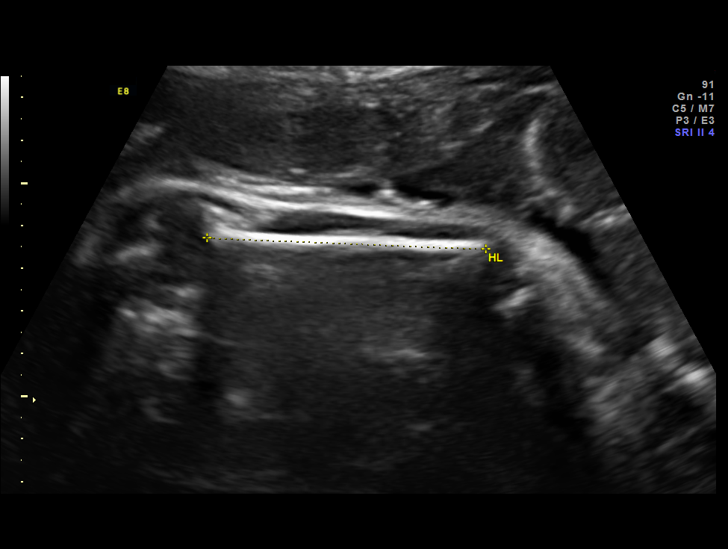
[im 41/47]
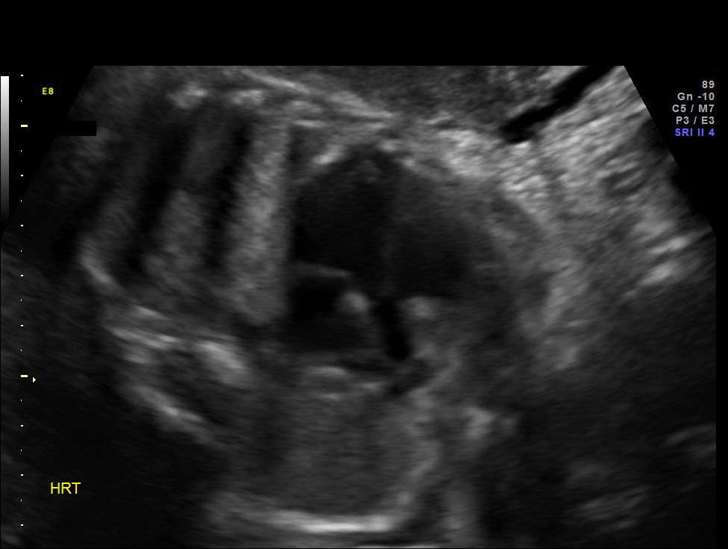
[im 45/47]
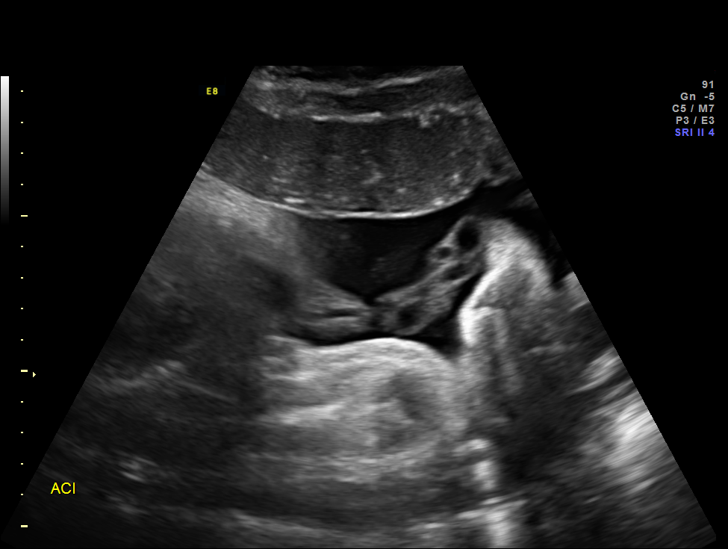

[12 of 28 positions shown; findings below may reference images not displayed]

OBSTETRICS REPORT

Service(s) Provided

 US OB FOLLOW UP                                       76816.1
Indications

 Vaginal bleeding
 Placental abruption
 Poor obstetric history: Previous preterm delivery
 (36 week PROM)
 Poor obstetric history: Previous gestational HTN
Fetal Evaluation

 Num Of Fetuses:    1
 Cardiac Activity:  Observed
 Presentation:      Cephalic
 Placenta:          Anterior, above cervical os
 P. Cord            Previously Visualized
 Insertion:

 Comment:    No placental abruption or previa identified.

 Amniotic Fluid
 AFI FV:      Subjectively within normal limits
 AFI Sum:     8.54    cm       10  %Tile     Larg Pckt:    3.92  cm
 RUQ:   1.47    cm   LUQ:    3.92   cm    LLQ:   3.15    cm
Biometry

 BPD:     86.1  mm     G. Age:  34w 5d                CI:         78.1   70 - 86
 OFD:    110.3  mm                                    FL/HC:      21.9   20.1 -

 HC:     313.3  mm     G. Age:  35w 1d       14  %    HC/AC:      0.92   0.93 -

 AC:       342  mm     G. Age:  38w 1d     > 97  %    FL/BPD:     79.8   71 - 87
 FL:      68.7  mm     G. Age:  35w 2d       40  %    FL/AC:      20.1   20 - 24
 HUM:     65.3  mm     G. Age:  37w 6d     > 95  %
 CER:     44.9  mm     G. Age:  38w 6d       81  %

 Est. FW:    7666  gm    6 lb 10 oz      83  %
Gestational Age

 U/S Today:     35w 6d                                        EDD:   03/16/14
 Best:          35w 3d     Det. By:  Early Ultrasound         EDD:   03/19/14
                                     (08/15/13)
Anatomy

 Cranium:          Appears normal         Aortic Arch:      Not well visualized
 Fetal Cavum:      Appears normal         Ductal Arch:      Not well visualized
 Ventricles:       Appears normal         Diaphragm:        Appears normal
 Choroid Plexus:   Previously seen        Stomach:          Appears normal, left
                                                            sided
 Cerebellum:       Appears normal         Abdomen:          Appears normal
 Posterior Fossa:  Appears normal         Abdominal Wall:   Appears nml (cord
                                                            insert, abd wall)
 Nuchal Fold:      Not applicable (>20    Cord Vessels:     Previously seen
                   wks GA)
 Face:             Orbits previously      Kidneys:          Appear normal
                   seen
 Lips:             Previously seen        Bladder:          Appears normal
 Heart:            Appears normal         Spine:            Not well visualized
                   (4CH, axis, and
                   situs)
 RVOT:             Appears normal         Lower             Not well visualized
                                          Extremities:
 LVOT:             Appears normal         Upper             Not well visualized
                                          Extremities:

 Other:  Technically difficult due to advanced GA and fetal position.
Targeted Anatomy

 Fetal Central Nervous System
 Cisterna Magna:
Cervix Uterus Adnexa

 Cervix:       Not visualized (advanced GA >42wks)
 Left Ovary:    Within normal limits.
 Right Ovary:   Within normal limits.

 Adnexa:     No abnormality visualized.
Myomas

 Site                     L(cm)      W(cm)       D(cm)      Location
 Anterior

 Blood Flow                  RI       PI       Comments

Impression

 Single IUP at 35w 3d
 EFW at the 83rd %tile.
 No dysmorphic features demonstrated on images today.
 An anterior placenta is noted without previa
 No subchorionic fluid collections or placental abnormalities
 noted
 Incidental note is made of a small uterine myoma as above
 Normal amniotic fluid volume
Recommendations

 Follow up as clinically indicated.
 Recommend inpatient observation for 5-7 days after bleeding
 episode (no bright red blood).

## 2017-09-01 NOTE — L&D Delivery Note (Addendum)
Patient is a 37 y.o. now G4P3 s/p NSVD at 2660w5d, who presented to MAU complete and involuntary pushing. She delivered precipitously. Cord clamping delayed by several minutes then clamped by CNM and cut by patient.  She plans on breastfeeding.  Delivery Note At 1:31 AM a viable female was delivered via Vaginal, Spontaneous (Presentation: ROA ).  APGAR: 8, 9; weight pending.   Placenta intact and spontaneous, bleeding minimal. 3V Cord.  Anesthesia: None Episiotomy: None  Mom and baby stable prior to transfer to postpartum.  Baby to Couplet care / Skin to Skin.  Laceration repaired by Dr Senaida Oresichardson- care transferred.   Sharyon CableVeronica C Rogers CNM 12/01/2017, 1:53 AM  As above, patient arrived at MAU complete and pushing.  She precipitously delivered and I arrived just after placenta delivered.  Pt examined and first degree laceration and right labial laceration injected with 1%plain lidocaine and repaired with 3-0 vicryl rapide.  Fundus firm and bleeding WNL.  Baby doing well and left skin to skin.

## 2017-12-01 ENCOUNTER — Inpatient Hospital Stay (HOSPITAL_COMMUNITY)
Admission: AD | Admit: 2017-12-01 | Discharge: 2017-12-03 | DRG: 807 | Disposition: A | Discharge status: Home / Self Care | Payer: BC Managed Care – PPO | Source: Ambulatory Visit | Attending: Obstetrics and Gynecology | Admitting: Obstetrics and Gynecology

## 2017-12-01 ENCOUNTER — Other Ambulatory Visit: Payer: Self-pay

## 2017-12-01 ENCOUNTER — Encounter (HOSPITAL_COMMUNITY): Payer: Self-pay

## 2017-12-01 DIAGNOSIS — O99824 Streptococcus B carrier state complicating childbirth: Secondary | ICD-10-CM | POA: Diagnosis present

## 2017-12-01 DIAGNOSIS — Z3A39 39 weeks gestation of pregnancy: Secondary | ICD-10-CM

## 2017-12-01 DIAGNOSIS — Z880 Allergy status to penicillin: Secondary | ICD-10-CM | POA: Diagnosis not present

## 2017-12-01 DIAGNOSIS — J45909 Unspecified asthma, uncomplicated: Secondary | ICD-10-CM | POA: Diagnosis present

## 2017-12-01 DIAGNOSIS — O9952 Diseases of the respiratory system complicating childbirth: Secondary | ICD-10-CM | POA: Diagnosis present

## 2017-12-01 DIAGNOSIS — Z3483 Encounter for supervision of other normal pregnancy, third trimester: Secondary | ICD-10-CM | POA: Diagnosis present

## 2017-12-01 DIAGNOSIS — O4693 Antepartum hemorrhage, unspecified, third trimester: Secondary | ICD-10-CM

## 2017-12-01 LAB — COMPREHENSIVE METABOLIC PANEL
ALT: 18 U/L (ref 14–54)
AST: 30 U/L (ref 15–41)
Albumin: 2.9 g/dL — ABNORMAL LOW (ref 3.5–5.0)
Alkaline Phosphatase: 173 U/L — ABNORMAL HIGH (ref 38–126)
Anion gap: 11 (ref 5–15)
BUN: 9 mg/dL (ref 6–20)
CALCIUM: 8.9 mg/dL (ref 8.9–10.3)
CO2: 19 mmol/L — AB (ref 22–32)
CREATININE: 0.55 mg/dL (ref 0.44–1.00)
Chloride: 103 mmol/L (ref 101–111)
Glucose, Bld: 118 mg/dL — ABNORMAL HIGH (ref 65–99)
Potassium: 3.8 mmol/L (ref 3.5–5.1)
SODIUM: 133 mmol/L — AB (ref 135–145)
Total Bilirubin: 0.4 mg/dL (ref 0.3–1.2)
Total Protein: 6.6 g/dL (ref 6.5–8.1)

## 2017-12-01 LAB — CBC
HEMATOCRIT: 33.3 % — AB (ref 36.0–46.0)
Hemoglobin: 10.8 g/dL — ABNORMAL LOW (ref 12.0–15.0)
MCH: 24.9 pg — AB (ref 26.0–34.0)
MCHC: 32.4 g/dL (ref 30.0–36.0)
MCV: 76.7 fL — AB (ref 78.0–100.0)
Platelets: 172 10*3/uL (ref 150–400)
RBC: 4.34 MIL/uL (ref 3.87–5.11)
RDW: 15.5 % (ref 11.5–15.5)
WBC: 14.8 10*3/uL — ABNORMAL HIGH (ref 4.0–10.5)

## 2017-12-01 LAB — RPR: RPR Ser Ql: NONREACTIVE

## 2017-12-01 LAB — TYPE AND SCREEN
ABO/RH(D): A POS
Antibody Screen: NEGATIVE

## 2017-12-01 MED ORDER — SENNOSIDES-DOCUSATE SODIUM 8.6-50 MG PO TABS
2.0000 | ORAL_TABLET | ORAL | Status: DC
  Administered 2017-12-01 – 2017-12-02 (×2): 2 via ORAL
  Filled 2017-12-01 (×2): qty 2

## 2017-12-01 MED ORDER — IBUPROFEN 600 MG PO TABS
600.0000 mg | ORAL_TABLET | Freq: Four times a day (QID) | ORAL | Status: DC
  Administered 2017-12-01 – 2017-12-03 (×10): 600 mg via ORAL
  Filled 2017-12-01 (×10): qty 1

## 2017-12-01 MED ORDER — SIMETHICONE 80 MG PO CHEW: 80.0000 mg | CHEWABLE_TABLET | ORAL | Status: DC | PRN

## 2017-12-01 MED ORDER — COCONUT OIL OIL
1.0000 "application " | TOPICAL_OIL | Status: DC | PRN
  Filled 2017-12-01: qty 120

## 2017-12-01 MED ORDER — TETANUS-DIPHTH-ACELL PERTUSSIS 5-2.5-18.5 LF-MCG/0.5 IM SUSP: 0.5000 mL | Freq: Once | INTRAMUSCULAR | Status: DC

## 2017-12-01 MED ORDER — DIBUCAINE 1 % RE OINT: 1.0000 "application " | TOPICAL_OINTMENT | RECTAL | Status: DC | PRN

## 2017-12-01 MED ORDER — ONDANSETRON HCL 4 MG/2ML IJ SOLN: 4.0000 mg | INTRAMUSCULAR | Status: DC | PRN

## 2017-12-01 MED ORDER — ZOLPIDEM TARTRATE 5 MG PO TABS: 5.0000 mg | ORAL_TABLET | Freq: Every evening | ORAL | Status: DC | PRN

## 2017-12-01 MED ORDER — PRENATAL MULTIVITAMIN CH
1.0000 | ORAL_TABLET | Freq: Every day | ORAL | Status: DC
  Administered 2017-12-01 – 2017-12-02 (×2): 1 via ORAL
  Filled 2017-12-01 (×2): qty 1

## 2017-12-01 MED ORDER — ONDANSETRON HCL 4 MG PO TABS: 4.0000 mg | ORAL_TABLET | ORAL | Status: DC | PRN

## 2017-12-01 MED ORDER — FENTANYL CITRATE (PF) 100 MCG/2ML IJ SOLN
50.0000 ug | Freq: Once | INTRAMUSCULAR | Status: AC
  Administered 2017-12-01: 50 ug via INTRAMUSCULAR
  Filled 2017-12-01: qty 2

## 2017-12-01 MED ORDER — OXYTOCIN 10 UNIT/ML IJ SOLN
INTRAMUSCULAR | Status: AC
  Administered 2017-12-01: 10 [IU] via INTRAMUSCULAR
  Filled 2017-12-01: qty 1

## 2017-12-01 MED ORDER — LIDOCAINE HCL (PF) 1 % IJ SOLN
INTRAMUSCULAR | Status: AC
  Administered 2017-12-01: 02:00:00
  Filled 2017-12-01: qty 30

## 2017-12-01 MED ORDER — WITCH HAZEL-GLYCERIN EX PADS: 1.0000 "application " | MEDICATED_PAD | CUTANEOUS | Status: DC | PRN

## 2017-12-01 MED ORDER — OXYTOCIN 10 UNIT/ML IJ SOLN
10.0000 [IU] | Freq: Once | INTRAMUSCULAR | Status: AC
  Administered 2017-12-01: 10 [IU] via INTRAMUSCULAR

## 2017-12-01 MED ORDER — DIPHENHYDRAMINE HCL 25 MG PO CAPS: 25.0000 mg | ORAL_CAPSULE | Freq: Four times a day (QID) | ORAL | Status: DC | PRN

## 2017-12-01 MED ORDER — ACETAMINOPHEN 325 MG PO TABS: 650.0000 mg | ORAL_TABLET | ORAL | Status: DC | PRN

## 2017-12-01 MED ORDER — BENZOCAINE-MENTHOL 20-0.5 % EX AERO
1.0000 "application " | INHALATION_SPRAY | CUTANEOUS | Status: DC | PRN
  Filled 2017-12-01: qty 56

## 2017-12-01 NOTE — Progress Notes (Signed)
Post Partum Day 0 Subjective: no complaints, voiding, tolerating PO, + flatus and bonding well with baby - doing skin to skin. No HA, BLurry vision or CP  Objective: Blood pressure 137/70, pulse 72, temperature 98.1 F (36.7 C), resp. rate (!) 22, height 5\' 7"  (1.702 m), weight 269 lb (122 kg), SpO2 99 %, unknown if currently breastfeeding.  Physical Exam:  General: alert, cooperative and no distress Lochia: appropriate Uterine Fundus: firm Incision: n/a DVT Evaluation: No evidence of DVT seen on physical exam.  Recent Labs    12/01/17 0414  HGB 10.8*  HCT 33.3*    Assessment/Plan: Breastfeeding and Circumcision prior to discharge  Routine pp care - monitor BPs   LOS: 0 days   Alicia Butler 12/01/2017, 9:30 AM

## 2017-12-01 NOTE — Progress Notes (Signed)
Report to MB

## 2017-12-01 NOTE — H&P (Signed)
Alicia Butler is a 37 y.o. female [id number] at 6339 5/7 weeks (EDD 12/03/17 by LMP c/w 9 week US) presenting to MAU with precipitous delivery upon arrival.   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   Past OB Hx EAB x 1 SAB x 1 2007 5#13oz PIH oligo 2015 7#8oz 37 weeks, bleeding   Past Medical History:  Diagnosis Date  . Asthma   . SVD (spontaneous vaginal delivery) 03/02/2014   Past Surgical History:  Procedure Laterality Date  . NO PAST SURGERIES     Family History: family history includes Asthma in her father; Cancer in her paternal aunt; Diabetes in her maternal grandmother and mother; Heart disease in her paternal uncle; Hypertension in her father. Social History:  reports that she has never smoked. She has never used smokeless tobacco. She reports that she drinks alcohol. She reports that she does not use drugs.     Maternal Diabetes: No Genetic Screening: Normal Maternal Ultrasounds/Referrals: Normal Fetal Ultrasounds or other Referrals:  None Maternal Substance Abuse:  No Significant Maternal Medications:  None Significant Maternal Lab Results:  Lab values include: Group B Strep positive Other Comments:  None  Review of Systems  Gastrointestinal: Positive for abdominal pain.   Maternal Medical History:  Reason for admission: Rupture of membranes and contractions.   Contractions: Onset was 1-2 hours ago.   Frequency: regular.   Perceived severity is strong.    Fetal activity: Perceived fetal activity is normal.    Prenatal Complications - Diabetes: none.    Dilation: 10 Effacement (%): 100 Station: Plus 2 Exam by:: Steward DroneVeronica Rogers CNM Blood pressure (!) 156/86, pulse 84, resp. rate 18, unknown if currently breastfeeding. Maternal Exam:  Uterine Assessment: Contraction strength is firm.  Contraction frequency is regular.   Abdomen: Patient reports no  abdominal tenderness. Fetal presentation: vertex  Introitus: Normal vulva. Normal vagina.    Physical Exam  Constitutional: She appears well-developed.  Cardiovascular: Normal rate and regular rhythm.  Respiratory: Effort normal.  GI: Soft.  Genitourinary: Vagina normal and uterus normal.  Neurological: She is alert.  Psychiatric: She has a normal mood and affect.    Prenatal labs: ABO, Rh:  A positive Antibody:  Negative Rubella:  Immune RPR:   NR HBsAg:  Neg  HIV:  NR  GBS:   Positive--clindamycin susceptible One hour GCT 113  Assessment/Plan: Pt s/p precipitous vaginal delivery on arrival to MAU.  GBS was untreated due to rapidity of delivery. BP elevated throughout, but pt was abvious in a lot of pain,  BP  antenatally highest 140-150/80 but most 120/70-80 range.  Will check CBC and CMP and follow to see if normalize post hectic delivery!  D/w pt circumcision and plans in hospital but needs to pay at office.   Oliver PilaKathy W Xylon Croom 12/01/2017, 2:34 AM

## 2017-12-01 NOTE — Lactation Note (Signed)
This note was copied from a baby's chart. Lactation Consultation Note  Patient Name: Alicia Kinnie FeilLatasha Butler ZOXWR'UToday's Date: 12/01/2017 Reason for consult: Initial assessment;Term;Other (Comment)(exp breast feeder of 2 others , per mom requested to have time for a nap, and enc  to page )  Pecola LeisureBaby is 12 hours old and has breast fed 20 mins x 2 and on e 5 min feeding.  AS LC entered the room baby asleep in the crib, LC offered to place baby STS and mom  Declined at this time due to feeling exhausted and wanting to nap.  Mom aware to call on the nurses light for Lactation or RN to check latch.  Per mom has ordered her DEBP from insurance company .  Mother informed of post-discharge support and given phone number to the lactation department, including services for phone call assistance; out-patient appointments; and breastfeeding support group. List of other breastfeeding resources in the community given in the handout. Encouraged mother to call for problems or concerns related to breastfeeding.    Maternal Data Does the patient have breastfeeding experience prior to this delivery?: Yes  Feeding Feeding Type: (mom aware to page ) Length of feed: 5 min  LATCH Score                   Interventions Interventions: Breast feeding basics reviewed  Lactation Tools Discussed/Used WIC Program: No   Consult Status Consult Status: Follow-up Date: 12/01/17 Follow-up type: In-patient    Alicia Butler 12/01/2017, 2:22 PM

## 2017-12-02 NOTE — Progress Notes (Signed)
PPD #1 No problems Afeb, VSS Fundus firm, NT at U-0 Continue routine postpartum care, will check on payment for circumcision

## 2017-12-03 ENCOUNTER — Inpatient Hospital Stay (HOSPITAL_COMMUNITY)
Admission: RE | Admit: 2017-12-03 | Discharge: 2017-12-03 | Disposition: A | Discharge status: Home / Self Care | Payer: BC Managed Care – PPO | Source: Ambulatory Visit | Attending: Obstetrics and Gynecology | Admitting: Obstetrics and Gynecology

## 2017-12-03 ENCOUNTER — Encounter (HOSPITAL_COMMUNITY): Payer: Self-pay | Admitting: *Deleted

## 2017-12-03 MED ORDER — CVS PRENATAL GUMMY 0.4-113.5 MG PO CHEW: 1.0000 | CHEWABLE_TABLET | Freq: Every day | ORAL | 3 refills | Status: DC

## 2017-12-03 MED ORDER — IBUPROFEN 600 MG PO TABS: 600.0000 mg | ORAL_TABLET | Freq: Four times a day (QID) | ORAL | 1 refills | Status: DC | PRN

## 2017-12-03 NOTE — Progress Notes (Signed)
Post Partum Day 2 Subjective: up ad lib, voiding, tolerating PO and nl lochia, pain controlled  Objective: Blood pressure 121/67, pulse 68, temperature 98 F (36.7 C), temperature source Oral, resp. rate 20, height 5\' 7"  (1.702 m), weight 122 kg (269 lb), SpO2 100 %, unknown if currently breastfeeding.  Physical Exam:  General: alert and no distress Lochia: appropriate Uterine Fundus: firm   Recent Labs    12/01/17 0414  HGB 10.8*  HCT 33.3*    Assessment/Plan: Discharge home, Breastfeeding and Lactation consult.  Routine PP care.  Circumcision in office   LOS: 2 days   Decarla Siemen Bovard-Stuckert 12/03/2017, 8:00 AM

## 2017-12-03 NOTE — Discharge Summary (Signed)
OB Discharge Summary     Patient Name: Alicia Butler DOB: 01/30/1981 MRN: [id number]  Date of admission: 12/01/2017 Delivering MD: Sharyon CableOGERS, VERONICA C   Date of discharge: 12/03/2017  Admitting diagnosis: 40 WEEKS CTX Intrauterine pregnancy: 8666w0d     Secondary diagnosis:  Active Problems:   Precipitous delivery  Additional problems: N/A     Discharge diagnosis: Term Pregnancy Delivered                                                                                                Post partum procedures:N/A  Augmentation: N/A  Complications: None  Hospital course:  Onset of Labor With Vaginal Delivery     37 y.o. yo [id number] at 3566w0d was admitted in Active Labor on 12/01/2017. Patient had an uncomplicated labor course as follows:  Membrane Rupture Time/Date: 1:15 AM ,12/01/2017   Intrapartum Procedures: Episiotomy: None [1]                                         Lacerations:  1st degree [2]  Patient had a delivery of a Viable infant. 12/01/2017  Information for the patient's newborn:  Alicia Butler, Boy Kaely [[id number]][[id number]]  Delivery Method: Vag-Spont    Pateint had an uncomplicated postpartum course.  She is ambulating, tolerating a regular diet, passing flatus, and urinating well. Patient is discharged home in stable condition on 12/03/17.   Physical exam  Vitals:   12/02/17 2000 12/03/17 0003 12/03/17 0400 12/03/17 0810  BP: 127/71 (!) 128/55 121/67 124/60  Pulse: 86 71 68 66  Resp: 20 18 20 18   Temp: 98.3 F (36.8 C) 98.4 F (36.9 C) 98 F (36.7 C) 98 F (36.7 C)  TempSrc: Oral Oral Oral Oral  SpO2: 100% 100% 100%   Weight:      Height:       General: alert and no distress Lochia: appropriate Uterine Fundus: firm  Labs: Lab Results  Component Value Date   WBC 14.8 (H) 12/01/2017   HGB 10.8 (L) 12/01/2017   HCT 33.3 (L) 12/01/2017   MCV 76.7 (L) 12/01/2017   PLT 172 12/01/2017   CMP Latest Ref Rng & Units 12/01/2017  Glucose 65 - 99 mg/dL 130(Q118(H)  BUN 6 - 20  mg/dL 9  Creatinine 6.570.44 - 8.461.00 mg/dL 9.620.55  Sodium 952135 - 841145 mmol/L 133(L)  Potassium 3.5 - 5.1 mmol/L 3.8  Chloride 101 - 111 mmol/L 103  CO2 22 - 32 mmol/L 19(L)  Calcium 8.9 - 10.3 mg/dL 8.9  Total Protein 6.5 - 8.1 g/dL 6.6  Total Bilirubin 0.3 - 1.2 mg/dL 0.4  Alkaline Phos 38 - 126 U/L 173(H)  AST 15 - 41 U/L 30  ALT 14 - 54 U/L 18    Discharge instruction: per After Visit Summary and "Baby and Me Booklet".  After visit meds:  Allergies as of 12/03/2017      Reactions   Iodine Anaphylaxis   Shellfish Allergy Anaphylaxis, Hives   Acrylic Polymer [carbomer] Hives   Penicillins  Hives   Has patient had a PCN reaction causing immediate rash, facial/tongue/throat swelling, SOB or lightheadedness with hypotension: No Has patient had a PCN reaction causing severe rash involving mucus membranes or skin necrosis: No Has patient had a PCN reaction that required hospitalization: No Has patient had a PCN reaction occurring within the last 10 years: No If all of the above answers are "NO", then may proceed with Cephalosporin use.      Medication List    TAKE these medications   albuterol 108 (90 Base) MCG/ACT inhaler Commonly known as:  PROVENTIL HFA;VENTOLIN HFA Inhale 2 puffs into the lungs every 6 (six) hours as needed for wheezing or shortness of breath.   [employer] PRENATAL GUMMY 0.4-113.5 MG Chew Chew 1 capsule by mouth daily. What changed:    medication strength  how much to take   ferrous sulfate 325 (65 FE) MG tablet Take 325 mg by mouth daily with breakfast.   ibuprofen 600 MG tablet Commonly known as:  ADVIL,MOTRIN Take 1 tablet (600 mg total) by mouth every 6 (six) hours as needed.       Diet: routine diet  Activity: Advance as tolerated. Pelvic rest for 6 weeks.   Outpatient follow up:6 weeks Follow up Appt:No future appointments. Follow up Visit:No follow-ups on file.  Postpartum contraception: Undecided  Newborn Data: Live born female  Birth Weight: 9  lb 5 oz (4224 g) APGAR: 8, 9  Newborn Delivery   Birth date/time:  12/01/2017 01:31:00 Delivery type:  Vaginal, Spontaneous     Baby Feeding: Bottle and Breast Disposition:home with mother   12/03/2017 Sherian Rein, MD

## 2017-12-03 NOTE — Lactation Note (Signed)
This note was copied from a baby's chart. Lactation Consultation Note Baby 48 hrs old. Experienced BF mom states BF going well, has no questions or concerns. Mom has large breast w/everted nipples. Mom BF in cradle position. Mom denies painful latching. Engorgement prevention, clogged ducts, supply and demand discussed. Reminded mom of LC OP serviced if needed. Mom denies questions or concerns. LC encouraged mom to BF comfortable and use good support.  Patient Name: Alicia Kinnie FeilLatasha Butler [id number]'s Date: 12/03/2017 Reason for consult: Follow-up assessment   Maternal Data    Feeding Feeding Type: Breast Fed Length of feed: (still BF)  LATCH Score Latch: Grasps breast easily, tongue down, lips flanged, rhythmical sucking.  Audible Swallowing: Spontaneous and intermittent  Type of Nipple: Everted at rest and after stimulation  Comfort (Breast/Nipple): Soft / non-tender  Hold (Positioning): No assistance needed to correctly position infant at breast.  LATCH Score: 10  Interventions Interventions: Breast feeding basics reviewed;Support pillows;Skin to skin;Breast massage;Breast compression;Adjust position  Lactation Tools Discussed/Used     Consult Status Consult Status: Complete Date: 12/03/17    Charyl DancerCARVER, Kashawn Dirr G 12/03/2017, 2:04 AM

## 2019-06-25 ENCOUNTER — Other Ambulatory Visit: Payer: Self-pay

## 2019-06-25 DIAGNOSIS — Z20822 Contact with and (suspected) exposure to covid-19: Secondary | ICD-10-CM

## 2019-06-26 LAB — NOVEL CORONAVIRUS, NAA: SARS-CoV-2, NAA: NOT DETECTED

## 2020-08-20 ENCOUNTER — Other Ambulatory Visit: Payer: BC Managed Care – PPO

## 2020-08-21 ENCOUNTER — Other Ambulatory Visit: Payer: BC Managed Care – PPO

## 2020-10-19 ENCOUNTER — Ambulatory Visit: Payer: Self-pay | Admitting: General Surgery

## 2022-07-09 ENCOUNTER — Ambulatory Visit: Payer: BC Managed Care – PPO | Admitting: Bariatrics

## 2022-07-09 ENCOUNTER — Encounter: Payer: Self-pay | Admitting: Bariatrics

## 2022-07-09 VITALS — BP 138/84 | HR 90 | Temp 98.0°F | Ht 67.0 in | Wt 233.0 lb

## 2022-07-09 DIAGNOSIS — R5383 Other fatigue: Secondary | ICD-10-CM | POA: Diagnosis not present

## 2022-07-09 DIAGNOSIS — E7800 Pure hypercholesterolemia, unspecified: Secondary | ICD-10-CM

## 2022-07-09 DIAGNOSIS — R7309 Other abnormal glucose: Secondary | ICD-10-CM

## 2022-07-09 DIAGNOSIS — Z6836 Body mass index (BMI) 36.0-36.9, adult: Secondary | ICD-10-CM

## 2022-07-09 DIAGNOSIS — Z833 Family history of diabetes mellitus: Secondary | ICD-10-CM

## 2022-07-09 DIAGNOSIS — E669 Obesity, unspecified: Secondary | ICD-10-CM | POA: Diagnosis not present

## 2022-07-21 ENCOUNTER — Encounter: Payer: Self-pay | Admitting: Bariatrics

## 2022-07-21 NOTE — Progress Notes (Signed)
Office: [phone]  /  Fax: [phone]   Initial Visit  Alicia Butler was seen in clinic today to evaluate for obesity. She is interested in losing weight to improve overall health and reduce the risk of weight related complications. She presents today to review program treatment options, initial physical assessment, and evaluation.     She was referred by: Self-Referral  When asked what else they would like to accomplish? She states: Improve quality of life  When asked how has your weight affected you? She states: Having fatigue  Contributing factors: Family Butler and Stress  Current nutrition plan: Portion control / smart choices  Current level of physical activity: None and Step counting  Current or previous pharmacotherapy: GLP-1  Response to medication: Lost weight initially but was unable to sustain weight loss   Past medical Butler includes:   Past Medical Butler:  Diagnosis Date   Asthma    SVD (spontaneous vaginal delivery) 03/02/2014     Objective:   BP 138/84   Pulse 90   Temp 98 F (36.7 C)   Ht 5\' 7"  (1.702 m)   Wt 233 lb (105.7 kg)   SpO2 98%   BMI 36.49 kg/m  She was weighed on the bioimpedance scale: Body mass index is 36.49 kg/m.  Peak Weight: 268 lbs ,Visceral Fat Rating:10, Body Fat%:41.4, Weight trend over the last 12 months: Decreasing  General:  Alert, oriented and cooperative. Patient is in no acute distress.  Respiratory: Normal respiratory effort, no problems with respiration noted  Extremities: Normal range of motion.    Mental Status: Normal mood and affect. Normal behavior. Normal judgment and thought content.   Assessment and Plan:  1. Elevated glucose Alicia Butler last glucose level on her old labs was 118. She is not on medications.   Plan: She will keep all carbohydrates low (sugar and starches).   2. Elevated cholesterol Alicia Butler last labs last years showed total cholesterol of 221 and LDL 156. She is not on  medications.   Plan: We will obtain labs at her next visit.   3. Other fatigue Alicia Butler fatigue with certain activities.   Plan: EKG and labs will be obtained at her next visit.   - EKG 12-Lead  4. Family Butler of diabetes mellitus in grandmother Alicia Butler of diabetes with her grandmother.   Plan: We will obtain labs at her next visit.   5. Obesity, Current BMI 36.6 Alicia Butler in 2-3 weeks. Smart choices and healthy portions were discussed.   We reviewed weight, biometrics, associated medical conditions and contributing factors with patient. She would benefit from weight loss therapy via a modified calorie, low-carb, high-protein nutritional plan tailored to their REE (resting energy expenditure) which will be determined by indirect calorimetry.  We will also assess for cardiometabolic risk and nutritional derangements via fasting serologies at her next appointment.      Obesity Treatment / Action Plan:  Patient will work on garnering support from family and friends to begin weight loss journey. Will work on eliminating or reducing the presence of highly palatable, calorie dense foods in the home. Will complete provided nutritional and psychosocial assessment questionnaire before the next appointment. Will be scheduled for indirect calorimetry to determine resting energy expenditure in a fasting state.  This will allow [place] to create a reduced calorie, high-protein meal plan to promote loss of fat mass while preserving muscle mass.  Obesity Education Performed Today:  She was weighed on the bioimpedance  scale and results were discussed and documented in the synopsis.  We discussed obesity as a disease and the importance of a more detailed evaluation of all the factors contributing to the disease.  We discussed the importance of long term lifestyle changes which include nutrition, exercise and behavioral modifications as well as the importance  of customizing this to her specific health and social needs.  We discussed the benefits of reaching a healthier weight to alleviate the symptoms of existing conditions and reduce the risks of the biomechanical, metabolic and psychological effects of obesity.  Alicia Butler appears to be in the action stage of change and states they are ready to start intensive lifestyle modifications and behavioral modifications.  30 minutes was spent today on this visit including the above counseling, pre-visit chart review, and post-visit documentation.  Reviewed by clinician on day of visit: allergies, medications, problem list, medical Butler, surgical Butler, family Butler, social Butler, and previous encounter Butler.   Alicia Butler, am acting as transcriptionist for [employer], DO   I have reviewed the above documentation for accuracy and completeness, and I agree with the above. Alicia Capra, DO

## 2022-08-18 ENCOUNTER — Ambulatory Visit: Payer: BC Managed Care – PPO | Admitting: Bariatrics

## 2024-07-06 ENCOUNTER — Other Ambulatory Visit: Payer: Self-pay | Admitting: Surgery

## 2024-07-06 DIAGNOSIS — R1084 Generalized abdominal pain: Secondary | ICD-10-CM

## 2024-07-07 ENCOUNTER — Other Ambulatory Visit (HOSPITAL_COMMUNITY): Payer: Self-pay | Admitting: Surgery

## 2024-07-07 DIAGNOSIS — R1084 Generalized abdominal pain: Secondary | ICD-10-CM

## 2024-07-19 ENCOUNTER — Ambulatory Visit (HOSPITAL_COMMUNITY)
Admission: RE | Admit: 2024-07-19 | Discharge: 2024-07-19 | Disposition: A | Discharge status: Home / Self Care | Source: Ambulatory Visit | Attending: Surgery | Admitting: Surgery

## 2024-07-19 DIAGNOSIS — R1084 Generalized abdominal pain: Secondary | ICD-10-CM | POA: Insufficient documentation

## 2024-07-25 ENCOUNTER — Ambulatory Visit: Payer: Self-pay | Admitting: Surgery

## 2024-08-08 ENCOUNTER — Ambulatory Visit: Payer: Self-pay | Admitting: Surgery

## 2024-08-13 NOTE — Progress Notes (Signed)
 COVID Vaccine received:  []  No [x]  Yes Date of any COVID positive Test in last 90 days:  PCP - Ezzie Buba, MD  Cardiologist -   Chest x-ray -  EKG -  2023  repeat Stress Test -  ECHO -  Cardiac Cath -  CT Coronary Calcium  score:   Pacemaker / ICD device [x]  No []  Yes   Spinal Cord Stimulator:[x]  No []  Yes       History of Sleep Apnea? []  No []  Yes   CPAP used?- []  No []  Yes    Medication on DOS:   none  Hold DOS:  spironolactone  Patient has: [x]  NO Hx DM   []  Pre-DM   []  DM1  []   DM2 Does the patient monitor blood sugar?   [x]  N/A   []  No []  Yes  Last A1c was:5.0   on  06-07-2021     Blood Thinner / Instructions:  none Aspirin Instructions:  none  Activity level: Able to walk up 2 flights of stairs without becoming significantly short of breath or having chest pain?  []  No   []    Yes  Patient can perform ADLs without assistance. []  No   []   Yes  Comments:   Anesthesia review: Recurrent Ventral hernia ( original in 10-2023),  anemia, Asthma, HTN, Hx Phentermine usage  Patient denies any S&S of respiratory illness or Covid - no shortness of breath, fever, cough or chest pain at PAT appointment.  Patient verbalized understanding and agreement to the Pre-Surgical Instructions that were given to them at this PAT appointment. Patient was also educated of the need to review these PAT instructions again prior to her surgery.I reviewed the appropriate phone numbers to call if they have any and questions or concerns.

## 2024-08-13 NOTE — Patient Instructions (Signed)
 SURGICAL WAITING ROOM VISITATION Patients having surgery or a procedure may have no more than 2 support people in the waiting area - these visitors may rotate in the visitor waiting room.   If the patient needs to stay at the hospital during part of their recovery, the visitor guidelines for inpatient rooms apply.  PRE-OP VISITATION  Pre-op nurse will coordinate an appropriate time for 1 support person to accompany the patient in pre-op.  This support person may not rotate.  This visitor will be contacted when the time is appropriate for the visitor to come back in the pre-op area.  Temporary Visitor Restrictions   Children ages 39 and under will not be able to visit patients in [facility] under most circumstances. Visitation is not restricted outside of hospitals unless noted otherwise in the [facility] and Location Specific Visitation Guidelines at :       [email or web address]  Visitors with respiratory illnesses are discouraged from visiting and should remain at home.  You are not required to quarantine at this time prior to your surgery. However, you must do this: Hand Hygiene often Do NOT share personal items Notify your provider if you are in close contact with someone who has COVID or you develop fever 100.4 or greater, new onset of sneezing, cough, sore throat, shortness of breath or body aches.  If you test positive for Covid or have been in contact with anyone that has tested positive in the last 10 days please notify you surgeon.    Your procedure is scheduled on:  Wednesday  08-17-2024  Report to [facility] Main Entrance: [address] entrance where the [employer] is available.   Report to admitting at: 10:45    AM  Call this number if you have any questions or problems the morning of surgery [phone]  FOLLOW ANY ADDITIONAL PRE OP INSTRUCTIONS YOU RECEIVED FROM YOUR SURGEON'S OFFICE!!!  Do not eat food after Midnight the night prior to your  surgery/procedure.  After Midnight you may have the following liquids until  10:00 AM DAY OF SURGERY  Clear Liquid Diet Water Black Coffee (sugar ok, NO MILK/CREAM OR CREAMERS)  Tea (sugar ok, NO MILK/CREAM OR CREAMERS) regular and decaf                             Plain Jell-O  with no fruit (NO RED)                                           Fruit ices (not with fruit pulp, NO RED)                                     Popsicles (NO RED)                                                                  Juice: NO CITRUS JUICES: only apple, WHITE grape, WHITE cranberry Sports drinks like Gatorade or Powerade (NO RED)  Oral Hygiene is also important to reduce your risk of infection.        Remember - BRUSH YOUR TEETH THE MORNING OF SURGERY WITH YOUR REGULAR TOOTHPASTE  Do NOT smoke after Midnight the night before surgery.  STOP TAKING all Vitamins, Herbs and supplements 1 week before your surgery.   Take ONLY these medicines the morning of surgery with A SIP OF WATER:  none  DO NOT TAKE spironolactone the morning of your surgery.   You may not have any metal on your body including hair pins, jewelry, and body piercing  Do not wear make-up, lotions, powders, perfumes or deodorant  Do not wear nail polish including gel and S&S, artificial / acrylic nails, or any other type of covering on natural nails including finger and toenails. If you have artificial nails, gel coating, etc., that needs to be removed by a nail salon, Please have this removed prior to surgery. Not doing so may mean that your surgery could be cancelled or delayed if the Surgeon or anesthesia staff feels like they are unable to monitor you safely.   Do not shave 48 hours prior to surgery to avoid nicks in your skin which may contribute to postoperative infections.   Contacts, Hearing Aids, dentures or bridgework may not be worn into surgery. DENTURES WILL BE REMOVED PRIOR TO SURGERY PLEASE DO NOT APPLY  Poly grip OR ADHESIVES!!!  Patients discharged on the day of surgery will not be allowed to drive home.  Someone NEEDS to stay with you for the first 24 hours after anesthesia.  Do not bring your home medications to the hospital. The Pharmacy will dispense medications listed on your medication list to you during your admission in the Hospital.  Please read over the following fact sheets you were given: IF YOU HAVE QUESTIONS ABOUT YOUR PRE-OP INSTRUCTIONS, PLEASE CALL [phone]   [facility] - Preparing for Surgery         Before surgery, you can play an important role.  Because skin is not sterile, your skin needs to be as free of germs as possible.  You can reduce the number of germs on your skin by washing with CHG (chlorahexidine gluconate) soap before surgery.  CHG is an antiseptic cleaner which kills germs and bonds with the skin to continue killing germs even after washing. Please DO NOT use if you have an allergy to CHG or antibacterial soaps.  If your skin becomes reddened/irritated stop using the CHG and inform your nurse when you arrive at Short Stay. Do not shave (including legs and underarms) for at least 48 hours prior to the first CHG shower.  You may shave your face/neck.  Please follow these instructions carefully:  1.  Shower with CHG Soap the night before surgery ONLY (DO NOT USE THE CHG SOAP THE MORNING OF SURGERY).  2.  If you choose to wash your hair, wash your hair first as usual with your normal  shampoo.  3.  After you shampoo, rinse your hair and body thoroughly to remove the shampoo.                             4.  Use CHG as you would any other liquid soap.  You can apply chg directly to the skin and wash.  Gently with a scrungie or clean washcloth.  5.  Apply the CHG Soap to your body ONLY FROM THE NECK DOWN.   Do  not use on face/ open                           Wound or open sores. Avoid contact with eyes, ears mouth and genitals (private parts).                        Wash face,  Genitals (private parts) with your normal soap.             6.  Wash thoroughly, paying special attention to the area where your  surgery  will be performed.  7.  Thoroughly rinse your body with warm water from the neck down.  8.  DO NOT shower/wash with your normal soap after using and rinsing off the CHG Soap.                9.  Pat yourself dry with a clean towel.            10.  Wear clean pajamas.            11.  Place clean sheets on your bed the night of your first shower and do not  sleep with pets.  Day of Surgery : Do not apply any CHG, lotions/deodorants the morning of surgery.  Please wear clean clothes to the hospital/surgery center.   FAILURE TO FOLLOW THESE INSTRUCTIONS MAY RESULT IN THE CANCELLATION OF YOUR SURGERY  PATIENT SIGNATURE_________________________________  NURSE SIGNATURE__________________________________  ________________________________________________________________________

## 2024-08-15 ENCOUNTER — Encounter (HOSPITAL_COMMUNITY): Payer: Self-pay

## 2024-08-15 ENCOUNTER — Other Ambulatory Visit: Payer: Self-pay

## 2024-08-15 ENCOUNTER — Encounter (HOSPITAL_COMMUNITY): Admission: RE | Admit: 2024-08-15 | Discharge: 2024-08-15 | Attending: Surgery

## 2024-08-15 VITALS — BP 132/78 | HR 86 | Temp 98.3°F | Resp 22 | Ht 67.0 in | Wt 270.0 lb

## 2024-08-15 DIAGNOSIS — I10 Essential (primary) hypertension: Secondary | ICD-10-CM

## 2024-08-15 DIAGNOSIS — T505X5A Adverse effect of appetite depressants, initial encounter: Secondary | ICD-10-CM | POA: Diagnosis not present

## 2024-08-15 DIAGNOSIS — Z01818 Encounter for other preprocedural examination: Secondary | ICD-10-CM

## 2024-08-15 DIAGNOSIS — T505X5S Adverse effect of appetite depressants, sequela: Secondary | ICD-10-CM

## 2024-08-15 LAB — COMPREHENSIVE METABOLIC PANEL WITH GFR
ALT: 26 U/L (ref 0–44)
AST: 33 U/L (ref 15–41)
Albumin: 4.2 g/dL (ref 3.5–5.0)
Alkaline Phosphatase: 74 U/L (ref 38–126)
Anion gap: 8 (ref 5–15)
BUN: 10 mg/dL (ref 6–20)
CO2: 26 mmol/L (ref 22–32)
Calcium: 9.5 mg/dL (ref 8.9–10.3)
Chloride: 103 mmol/L (ref 98–111)
Creatinine, Ser: 0.84 mg/dL (ref 0.44–1.00)
GFR, Estimated: >60 mL/min (ref >60)
Glucose, Bld: 106 mg/dL — ABNORMAL HIGH (ref 70–99)
Potassium: 4.3 mmol/L (ref 3.5–5.1)
Sodium: 137 mmol/L (ref 135–145)
Total Bilirubin: 0.4 mg/dL (ref 0.0–1.2)
Total Protein: 6.9 g/dL (ref 6.5–8.1)

## 2024-08-15 LAB — CBC
HCT: 36.8 % (ref 36.0–46.0)
Hemoglobin: 11.2 g/dL — ABNORMAL LOW (ref 12.0–15.0)
MCH: 23.9 pg — ABNORMAL LOW (ref 26.0–34.0)
MCHC: 30.4 g/dL (ref 30.0–36.0)
MCV: 78.5 fL — ABNORMAL LOW (ref 80.0–100.0)
Platelets: 286 K/uL (ref 150–400)
RBC: 4.69 MIL/uL (ref 3.87–5.11)
RDW: 14.3 % (ref 11.5–15.5)
WBC: 4.4 K/uL (ref 4.0–10.5)
nRBC: 0 % (ref 0.0–0.2)

## 2024-08-17 ENCOUNTER — Other Ambulatory Visit: Payer: Self-pay

## 2024-08-17 ENCOUNTER — Encounter: Admission: RE | Disposition: A | Payer: Self-pay | Attending: Surgery

## 2024-08-17 ENCOUNTER — Ambulatory Visit (HOSPITAL_COMMUNITY): Admission: RE | Admit: 2024-08-17 | Discharge: 2024-08-17 | Disposition: A | Attending: Surgery | Admitting: Surgery

## 2024-08-17 ENCOUNTER — Encounter (HOSPITAL_COMMUNITY): Payer: Self-pay | Admitting: Medical

## 2024-08-17 ENCOUNTER — Encounter (HOSPITAL_COMMUNITY): Payer: Self-pay | Admitting: Surgery

## 2024-08-17 ENCOUNTER — Ambulatory Visit (HOSPITAL_COMMUNITY): Payer: Self-pay | Admitting: Certified Registered"

## 2024-08-17 DIAGNOSIS — I10 Essential (primary) hypertension: Secondary | ICD-10-CM

## 2024-08-17 DIAGNOSIS — K432 Incisional hernia without obstruction or gangrene: Secondary | ICD-10-CM | POA: Diagnosis present

## 2024-08-17 DIAGNOSIS — Z6841 Body Mass Index (BMI) 40.0 and over, adult: Secondary | ICD-10-CM | POA: Insufficient documentation

## 2024-08-17 DIAGNOSIS — J45909 Unspecified asthma, uncomplicated: Secondary | ICD-10-CM | POA: Insufficient documentation

## 2024-08-17 DIAGNOSIS — E66813 Obesity, class 3: Secondary | ICD-10-CM | POA: Insufficient documentation

## 2024-08-17 HISTORY — PX: VENTRAL HERNIA REPAIR: SHX424

## 2024-08-17 LAB — POCT PREGNANCY, URINE: Preg Test, Ur: NEGATIVE

## 2024-08-17 SURGERY — REPAIR, HERNIA, VENTRAL, LAPAROSCOPIC
Anesthesia: General | Site: Abdomen

## 2024-08-17 MED ORDER — PROPOFOL 10 MG/ML IV BOLUS
INTRAVENOUS | Status: AC
  Filled 2024-08-17: qty 20

## 2024-08-17 MED ORDER — CHLORHEXIDINE GLUCONATE 0.12 % MT SOLN
15.0000 mL | Freq: Once | OROMUCOSAL | Status: AC
  Administered 2024-08-17: 12:00:00 15 mL via OROMUCOSAL

## 2024-08-17 MED ORDER — LIDOCAINE HCL (CARDIAC) PF 100 MG/5ML IV SOSY
PREFILLED_SYRINGE | INTRAVENOUS | Status: DC | PRN
  Administered 2024-08-17: 13:00:00 60 mg via INTRAVENOUS

## 2024-08-17 MED ORDER — SODIUM CHLORIDE 0.9 % IR SOLN
Status: DC | PRN
  Administered 2024-08-17 (×2): 1000 mL

## 2024-08-17 MED ORDER — AMISULPRIDE (ANTIEMETIC) 5 MG/2ML IV SOLN: 10.0000 mg | Freq: Once | INTRAVENOUS | Status: DC | PRN

## 2024-08-17 MED ORDER — OXYCODONE HCL 5 MG/5ML PO SOLN: 5.0000 mg | Freq: Once | ORAL | Status: AC | PRN

## 2024-08-17 MED ORDER — METHOCARBAMOL 500 MG PO TABS: 500.0000 mg | ORAL_TABLET | Freq: Three times a day (TID) | ORAL | 2 refills | Status: AC | PRN

## 2024-08-17 MED ORDER — ONDANSETRON HCL 4 MG PO TABS: 4.0000 mg | ORAL_TABLET | Freq: Every day | ORAL | 1 refills | Status: AC | PRN

## 2024-08-17 MED ORDER — KETAMINE HCL 50 MG/5ML IJ SOSY
PREFILLED_SYRINGE | INTRAMUSCULAR | Status: DC | PRN
  Administered 2024-08-17: 13:00:00 30 mg via INTRAVENOUS

## 2024-08-17 MED ORDER — ACETAMINOPHEN 500 MG PO TABS: 1000.0000 mg | ORAL_TABLET | ORAL | Status: DC

## 2024-08-17 MED ORDER — CHLORHEXIDINE GLUCONATE CLOTH 2 % EX PADS: 6.0000 | MEDICATED_PAD | Freq: Once | CUTANEOUS | Status: DC

## 2024-08-17 MED ORDER — ORAL CARE MOUTH RINSE: 15.0000 mL | Freq: Once | OROMUCOSAL | Status: AC

## 2024-08-17 MED ORDER — MIDAZOLAM HCL 2 MG/2ML IJ SOLN
INTRAMUSCULAR | Status: AC
  Filled 2024-08-17: qty 2

## 2024-08-17 MED ORDER — CLINDAMYCIN PHOSPHATE 900 MG/50ML IV SOLN
900.0000 mg | INTRAVENOUS | Status: AC
  Administered 2024-08-17: 13:00:00 900 mg via INTRAVENOUS
  Filled 2024-08-17: qty 50

## 2024-08-17 MED ORDER — FENTANYL CITRATE (PF) 100 MCG/2ML IJ SOLN
INTRAMUSCULAR | Status: AC
  Filled 2024-08-17: qty 2

## 2024-08-17 MED ORDER — LACTATED RINGERS IR SOLN
Status: DC | PRN
  Administered 2024-08-17: 13:00:00 1

## 2024-08-17 MED ORDER — BUPIVACAINE-EPINEPHRINE 0.25% -1:200000 IJ SOLN
INTRAMUSCULAR | Status: DC | PRN
  Administered 2024-08-17: 13:00:00 30 mL

## 2024-08-17 MED ORDER — DEXAMETHASONE SOD PHOSPHATE PF 10 MG/ML IJ SOLN
INTRAMUSCULAR | Status: DC | PRN
  Administered 2024-08-17: 13:00:00 8 mg via INTRAVENOUS

## 2024-08-17 MED ORDER — ONDANSETRON HCL 4 MG/2ML IJ SOLN
INTRAMUSCULAR | Status: AC
  Filled 2024-08-17: qty 2

## 2024-08-17 MED ORDER — MIDAZOLAM HCL (PF) 2 MG/2ML IJ SOLN
INTRAMUSCULAR | Status: DC | PRN
  Administered 2024-08-17: 13:00:00 2 mg via INTRAVENOUS

## 2024-08-17 MED ORDER — SODIUM CHLORIDE 0.9 % IV SOLN: 12.5000 mg | INTRAVENOUS | Status: DC | PRN

## 2024-08-17 MED ORDER — LACTATED RINGERS IV SOLN: INTRAVENOUS | Status: DC

## 2024-08-17 MED ORDER — CELECOXIB 200 MG PO CAPS
200.0000 mg | ORAL_CAPSULE | Freq: Once | ORAL | Status: AC
  Administered 2024-08-17: 12:00:00 200 mg via ORAL
  Filled 2024-08-17: qty 1

## 2024-08-17 MED ORDER — ACETAMINOPHEN 500 MG PO TABS
1000.0000 mg | ORAL_TABLET | Freq: Once | ORAL | Status: AC
  Administered 2024-08-17: 12:00:00 1000 mg via ORAL
  Filled 2024-08-17: qty 2

## 2024-08-17 MED ORDER — PROPOFOL 10 MG/ML IV BOLUS
INTRAVENOUS | Status: DC | PRN
  Administered 2024-08-17: 13:00:00 200 mg via INTRAVENOUS

## 2024-08-17 MED ORDER — POLYETHYLENE GLYCOL 3350 17 GM/SCOOP PO POWD: 17.0000 g | Freq: Every day | ORAL | 0 refills | Status: AC

## 2024-08-17 MED ORDER — ROCURONIUM BROMIDE 10 MG/ML (PF) SYRINGE
PREFILLED_SYRINGE | INTRAVENOUS | Status: DC | PRN
  Administered 2024-08-17: 13:00:00 60 mg via INTRAVENOUS

## 2024-08-17 MED ORDER — OXYCODONE HCL 5 MG PO TABS
5.0000 mg | ORAL_TABLET | Freq: Once | ORAL | Status: AC | PRN
  Administered 2024-08-17: 15:00:00 5 mg via ORAL

## 2024-08-17 MED ORDER — OXYCODONE HCL 5 MG PO TABS: 5.0000 mg | ORAL_TABLET | Freq: Four times a day (QID) | ORAL | 0 refills | Status: AC | PRN

## 2024-08-17 MED ORDER — OXYCODONE HCL 5 MG PO TABS
ORAL_TABLET | ORAL | Status: AC
  Filled 2024-08-17: qty 1

## 2024-08-17 MED ORDER — FENTANYL CITRATE (PF) 50 MCG/ML IJ SOSY
25.0000 ug | PREFILLED_SYRINGE | INTRAMUSCULAR | Status: DC | PRN
  Administered 2024-08-17: 15:00:00 50 ug via INTRAVENOUS

## 2024-08-17 MED ADMIN — Fentanyl Citrate Preservative Free (PF) Inj 100 MCG/2ML: 50 ug | INTRAVENOUS | @ 13:00:00 | NDC 72572017001

## 2024-08-17 MED ADMIN — Sugammadex Sodium IV 200 MG/2ML (Base Equivalent): 200 mg | INTRAVENOUS | @ 14:00:00 | NDC 99999070036

## 2024-08-17 MED ADMIN — Fentanyl Citrate Preservative Free (PF) Inj 100 MCG/2ML: 100 ug | INTRAVENOUS | @ 13:00:00 | NDC 72572017025

## 2024-08-17 MED FILL — Ketamine HCl Soln Pref Syr 50 MG/5ML (10 MG/ML): INTRAMUSCULAR | Qty: 5 | Status: AC

## 2024-08-17 MED FILL — Fentanyl Citrate Preservative Free (PF) Inj 100 MCG/2ML: INTRAMUSCULAR | Qty: 2 | Status: AC

## 2024-08-17 MED FILL — Fentanyl Citrate PF Soln Prefilled Syringe 50 MCG/ML: INTRAMUSCULAR | Qty: 2 | Status: AC

## 2024-08-17 SURGICAL SUPPLY — 36 items
BINDER ABD UNIV 12 45-62 (WOUND CARE) IMPLANT
CLIP APPLIE 5 13 M/L LIGAMAX5 (MISCELLANEOUS) IMPLANT
DERMABOND ADVANCED .7 DNX12 (GAUZE/BANDAGES/DRESSINGS) IMPLANT
DEVICE SECURE STRAP 25 ABSORB (INSTRUMENTS) ×1 IMPLANT
DEVICE TROCAR PUNCTURE CLOSURE (ENDOMECHANICALS) ×1 IMPLANT
DRAPE UTILITY XL STRL (DRAPES) IMPLANT
DRAPE WARM FLUID 44X44 (DRAPES) ×1 IMPLANT
ELECT REM PT RETURN 15FT ADLT (MISCELLANEOUS) ×1 IMPLANT
GLOVE INDICATOR 8.0 STRL GRN (GLOVE) ×1 IMPLANT
GLOVE SS BIOGEL STRL SZ 7.5 (GLOVE) ×1 IMPLANT
GOWN STRL REUS W/ TWL XL LVL3 (GOWN DISPOSABLE) ×2 IMPLANT
GRASPER SUT TROCAR 14GX15 (MISCELLANEOUS) IMPLANT
IRRIGATION SUCT STRKRFLW 2 WTP (MISCELLANEOUS) IMPLANT
KIT BASIN OR (CUSTOM PROCEDURE TRAY) ×1 IMPLANT
KIT TURNOVER KIT A (KITS) ×1 IMPLANT
MARKER SKIN DUAL TIP RULER LAB (MISCELLANEOUS) ×1 IMPLANT
MESH OVITEX LPR PERM 15X15 4L (Mesh General) IMPLANT
NDL SPNL 22GX3.5 QUINCKE BK (NEEDLE) ×1 IMPLANT
NEEDLE SPNL 22GX3.5 QUINCKE BK (NEEDLE) ×1 IMPLANT
PAD POSITIONING PINK XL (MISCELLANEOUS) IMPLANT
PENCIL SMOKE EVACUATOR (MISCELLANEOUS) IMPLANT
PROTECTOR NERVE ULNAR (MISCELLANEOUS) IMPLANT
SET TUBE SMOKE EVAC HIGH FLOW (TUBING) ×1 IMPLANT
SHEARS HARMONIC 36 ACE (MISCELLANEOUS) IMPLANT
SLEEVE Z-THREAD 5X100MM (TROCAR) ×1 IMPLANT
SOLUTION ANTFG W/FOAM PAD STRL (MISCELLANEOUS) ×1 IMPLANT
SPIKE FLUID TRANSFER (MISCELLANEOUS) ×1 IMPLANT
SUT MNCRL AB 4-0 PS2 18 (SUTURE) ×1 IMPLANT
SUT NOVA NAB DX-16 0-1 5-0 T12 (SUTURE) ×2 IMPLANT
SUT VICRYL 0 UR6 27IN ABS (SUTURE) IMPLANT
TAPE CLOTH 4X10 WHT NS (GAUZE/BANDAGES/DRESSINGS) IMPLANT
TOWEL OR DSP ST BLU DLX 10/PK (DISPOSABLE) ×1 IMPLANT
TRAY FOLEY MTR SLVR 16FR STAT (SET/KITS/TRAYS/PACK) ×1 IMPLANT
TRAY LAPAROSCOPIC (CUSTOM PROCEDURE TRAY) ×1 IMPLANT
TROCAR 11X100 Z THREAD (TROCAR) IMPLANT
TROCAR Z-THREAD OPTICAL 5X100M (TROCAR) ×1 IMPLANT

## 2024-08-17 NOTE — Transfer of Care (Signed)
 Immediate Anesthesia Transfer of Care Note  Patient: Alicia Butler  Procedure(s) Performed: REPAIR, HERNIA, VENTRAL, LAPAROSCOPIC, WITH MESH (Abdomen)  Patient Location: PACU  Anesthesia Type:General  Level of Consciousness: awake and alert   Airway & Oxygen Therapy: Patient Spontanous Breathing and Patient connected to face mask oxygen  Post-op Assessment: Report given to RN and Post -op Vital signs reviewed and stable  Post vital signs: Reviewed and stable  Last Vitals:  Vitals Value Taken Time  BP 146/89 08/17/24 14:25  Temp    Pulse 95 08/17/24 14:28  Resp 18 08/17/24 14:28  SpO2 89 % 08/17/24 14:28  Vitals shown include unfiled device data.  Last Pain:  Vitals:   08/17/24 1214  TempSrc:   PainSc: 0-No pain         Complications: No notable events documented.

## 2024-08-17 NOTE — Discharge Instructions (Signed)

## 2024-08-17 NOTE — H&P (Signed)
 History of Present Illness: Alicia Butler is a 43 y.o. female who is seen today for follow-up of periumbilical pain. In March 2025 she underwent an open repair of an umbilical hernia without mesh. She developed pain over the last 1 month or so. CT scan obtained which showed a recurrent hernia above the previous repair site. She presents today to discuss options of repair. She does have pain above the umbilicus made worse with lifting and standing. The pain waxes and wanes. It is better with rest and being recumbent. No nausea or vomiting..    Review of Systems: A complete review of systems was obtained from the patient. I have reviewed this information and discussed as appropriate with the patient. See HPI as well for other ROS.    Medical History: Past Medical History:  Diagnosis Date  Anemia  Asthma, unspecified asthma severity, unspecified whether complicated, unspecified whether persistent (HHS-HCC)  Hypertension   There is no problem list on file for this patient.  History reviewed. No pertinent surgical history.   Allergies  Allergen Reactions  Iodine Anaphylaxis and Angioedema  iodine  Shellfish Containing Products Anaphylaxis and Hives  Penicillins Hives and Itching  Has patient had a PCN reaction causing immediate rash, facial/tongue/throat swelling, SOB or lightheadedness with hypotension: No Has patient had a PCN reaction causing severe rash involving mucus membranes or skin necrosis: No Has patient had a PCN reaction that required hospitalization: No Has patient had a PCN reaction occurring within the last 10 years: No If all of the above answers are NO, then may proceed with Cephalosporin use.   Current Outpatient Medications on File Prior to Visit  Medication Sig Dispense Refill  ergocalciferol, vitamin D2, 1,250 mcg (50,000 unit) capsule TAKE 1 CAPSULE BY MOUTH ONCE A WEEK FOR 84 DAYS  NUVARING 0.12-0.015 mg/24 hr vaginal ring INSERT ONE RING VAGINALLY AND  LEAVE IN PLACE FOR 3 CONSECUTIVE WEEKS, THEN REMOVE FOR 1 WEEK. INSERT NEW RING 7 DAYS AFTER THE LAST WAS REMOVED  levonorgestreL (MIRENA) IUD (Patient not taking: Reported on 12/04/2023)  phentermine (ADIPEX-P) 37.5 mg tablet Take 1 tablet by mouth once daily  spironolactone (ALDACTONE) 50 MG tablet Take 50 mg by mouth once daily   No current facility-administered medications on file prior to visit.   Family History  Problem Relation Age of Onset  Obesity Mother  High blood pressure (Hypertension) Mother  Stroke Father  High blood pressure (Hypertension) Father    Social History   Tobacco Use  Smoking Status Never  Smokeless Tobacco Never    Social History   Socioeconomic History  Marital status: Single  Tobacco Use  Smoking status: Never  Smokeless tobacco: Never  Substance and Sexual Activity  Alcohol use: Yes  Drug use: Never   Social Drivers of Corporate Investment Banker Strain: Low Risk (03/28/2021)  Received from [facility]  Overall Financial Resource Strain (CARDIA)  Difficulty of Paying Living Expenses: Not very hard  Food Insecurity: No Food Insecurity (09/24/2021)  Received from [facility]  Hunger Vital Sign  Within the past 12 months, you worried that your food would run out before you got the money to buy more.: Never true  Within the past 12 months, the food you bought just didn't last and you didn't have money to get more.: Never true  Transportation Needs: No Transportation Needs (03/28/2021)  Received from [facility] - Transportation  Lack of Transportation (Medical): No  Lack of Transportation (Non-Medical): No  Physical Activity:  Sufficiently Active (03/28/2021)  Received from [facility]  Exercise Vital Sign  On average, how many days per week do you engage in moderate to strenuous exercise (like a brisk walk)?: 3 days  On average, how many minutes do you engage in exercise at this level?: 60 min  Stress: No Stress Concern  Present (03/28/2021)  Received from [facility] - Occupational Stress Questionnaire  Feeling of Stress : Only a little  Received from [facility]  Social Network  Housing Stability: Unknown (12/04/2023)  Housing Stability Vital Sign  Homeless in the Last Year: No   Objective:   Vitals:  08/08/24 0947  PainSc: 4   There is no height or weight on file to calculate BMI.  Physical Exam Vitals reviewed.  Cardiovascular:  Rate and Rhythm: Normal rate.  Abdominal:  Hernia: A hernia is present. Hernia is present in the ventral area.   Comments: Bulge noted above the umbilicus reducible. Mild tenderness.  Musculoskeletal:  Cervical back: Normal range of motion.  Neurological:  Mental Status: She is alert.     Labs, Imaging and Diagnostic Testing:  EXAM: CT ABDOMEN AND PELVIS WITHOUT CONTRAST 07/19/2024 02:55:00 PM  TECHNIQUE: CT of the abdomen and pelvis was performed without the administration of intravenous contrast. Multiplanar reformatted images are provided for review. Automated exposure control, iterative reconstruction, and/or weight-based adjustment of the mA/kV was utilized to reduce the radiation dose to as low as reasonably achievable.  COMPARISON: None available.  CLINICAL HISTORY: Hernia repair surgery in March 2025 with subsequent pain above the surgical site beginning in June, progressively worsening.  FINDINGS:  LOWER CHEST: No acute abnormality.  LIVER: The liver is unremarkable.  GALLBLADDER AND BILE DUCTS: Gallbladder is unremarkable. No biliary ductal dilatation.  SPLEEN: No acute abnormality.  PANCREAS: No acute abnormality.  ADRENAL GLANDS: No acute abnormality.  KIDNEYS, URETERS AND BLADDER: No stones in the kidneys or ureters. No hydronephrosis. No perinephric or periureteral stranding. Urinary bladder is unremarkable.  GI AND BOWEL: Stomach demonstrates no acute abnormality. There  is no bowel obstruction. No bowel herniation.  PERITONEUM AND RETROPERITONEUM: No ascites. No free air.  VASCULATURE: Calcification of the aorta. No aneurysm.  LYMPH NODES: No lymphadenopathy.  REPRODUCTIVE ORGANS: No acute abnormality.  BONES AND SOFT TISSUES: No acute osseous abnormality. Scarring in the periumbilical anterior abdominal wall. Residual or . recurrent hernia above the umbilicus containing fat.  IMPRESSION: 1. Residual or recurrent hernia above the umbilicus containing fat, without bowel herniation. 2. Scarring in the periumbilical anterior abdominal wall.  Assessment and Plan:   Diagnoses and all orders for this visit:  Incisional hernia, without obstruction or gangrene    Patient found to have recurrent ventral hernia above the umbilical hernia repair from March 2025.  Causing pain therefore recommend laparoscopic repair of this with mesh. Risks and benefits of surgery reviewed as well as hernia complications and use of mesh and complications of that. She has had a previous tissue repair which failed therefore I think mesh in her case makes the most sense this time around.The risk of hernia repair include bleeding, Infection, Recurrence of the hernia, Mesh use, chronic pain, Organ injury, Bowel injury, Bladder injury, nerve injury with numbness around the incision, Death, and worsening of preexisting medical problems. The alternatives to surgery have been discussed as well.. Long term expectations of both operative and non operative treatments have been discussed. The patient agrees to proceed.   No follow-ups on file.  Alicia Butler  Alicia SHIPPER, MD

## 2024-08-17 NOTE — Op Note (Signed)
 Preoperative diagnosis: Incisional hernia measuring 4 cm x 5 cm recurrent without obstruction or gangrene  Postoperative diagnosis: Same  Procedure: Laparoscopic repair of incisional hernia with mesh Ovitex 15 cm x  15 cm LPR  Surgeon: Debby Shipper, MD  Anesthesia: General 0.25% Marcaine  with epinephrine   EBL: Minimal  Specimen: None  Indications for procedure: The patient is a 43 year old female had a broken umbilical hernia repair about 6 months ago.  She developed a bulge or discomfort above this.  CT scanning showed what appeared to be an incisional hernia involving the supraumbilical region.  We discussed options of open a laparoscopic repair.  Discussed complications.The risk of hernia repair include bleeding,  Infection,   Recurrence of the hernia,  Mesh use, chronic pain,  Organ injury,  Bowel injury,  Bladder injury,   nerve injury with numbness around the incision,  Death,  and worsening of preexisting  medical problems.  The alternatives to surgery have been discussed as well..  Long term expectations of both operative and non operative treatments have been discussed.   The patient agrees to proceed.    Description of procedure: The patient was met in the holding area questions were answered.  She was taken back to the operating room placed supine upon the operative table.  After induction of general esthesia by Dr. Sebastian the abdomen was prepped and draped in sterile fashion timeout performed.  A 5 mm left upper quadrant incision was made.  A 5 mm Optiview port was placed in the fascial layer to the abdominal wall under direct vision without difficulty.  The abdominal cavity is entered and pneumoperitoneum was established without difficulty to 15 mmHg CO2 pressure.  Laparoscopy performed.  There is no evidence of any injury with insertion of this trocar.  I then placed 2 additional 5 screws in the patient's right upper quadrant and the second the patient's right upper quadrant.   There were no intra-abdominal lesions.  Quadrant laparoscopy revealed no other abnormality.  The colon and small bowel was normal.  The stomach and liver were grossly normal.  No signs of inflammation.  Defect involve the periumbilical region measuring 5 cm x 4 cm.  She also had some element of diastases as well noted.  The standing and abdominal wall was most pronounced in the periumbilical region.  I placed a 12 mm left lower quadrant port.  Through that we used a 15 cm x 15 cm OVITEX mesh.  4 stay sutures were placed in the corners of the mass.  It was then placed through the trocar and deployed.  Suture passer used multiple sutures of securing the mesh to the abdominal wall.  Secure strap tacker was used to place 2 layers of tacks circumferentially around the skin.  This cover the defect quite nicely.  There is no undue tension.  There is no bleeding.  The abdominal cavity is deinsufflated.  It was then reinsufflated.  The abdominal cavity is carefully examined for any evidence of any bowel injury which there is none evident after this maneuver.  The 11 mm port was removed and the suture passer with the fascia was closed with 0 Vicryl using the suture passer and appropriately.  Examination of the abdominal cavity showed no signs of bleeding or evidence of injury.  The mesh looked well-secured.  The port sites were then removed.  There is no signs of port site bleeding.  The CO2 was allowed to escape.  Port sites were then closed with 4 Monocryl  and Dermabond.  Abdominal binder placed.  All counts found to be correct.  Patient was then awoke extubated taken to recovery in satisfactory condition.

## 2024-08-17 NOTE — Anesthesia Procedure Notes (Signed)
 Procedure Name: Intubation Date/Time: 08/17/2024 12:49 PM  Performed by: Metta Andrea NOVAK, CRNAPre-anesthesia Checklist: Patient identified, Emergency Drugs available, Suction available, Patient being monitored and Timeout performed Patient Re-evaluated:Patient Re-evaluated prior to induction Oxygen Delivery Method: Circle system utilized Preoxygenation: Pre-oxygenation with 100% oxygen Induction Type: IV induction Ventilation: Mask ventilation without difficulty Laryngoscope Size: Mac and 4 Grade View: Grade I Tube type: Oral Tube size: 7.0 mm Number of attempts: 1 Airway Equipment and Method: Stylet Placement Confirmation: ETT inserted through vocal cords under direct vision, positive ETCO2 and breath sounds checked- equal and bilateral Secured at: 21 cm Tube secured with: Tape Dental Injury: Teeth and Oropharynx as per pre-operative assessment

## 2024-08-17 NOTE — Interval H&P Note (Signed)
 History and Physical Interval Note:  08/17/2024 12:05 PM  Alicia Butler  has presented today for surgery, with the diagnosis of RECURRENT VENTRAL HERNIA.  The various methods of treatment have been discussed with the patient and family. After consideration of risks, benefits and other options for treatment, the patient has consented to  Procedures with comments: REPAIR, HERNIA, VENTRAL, LAPAROSCOPIC (N/A) - LAPAROSCOPIC RECURRENT VENTRAL HERNIA REPAIR WITH MESH as a surgical intervention.  The patient's history has been reviewed, patient examined, no change in status, stable for surgery.  I have reviewed the patient's chart and labs.  Questions were answered to the patient's satisfaction.   The risk of hernia repair include bleeding,  Infection,   Recurrence of the hernia,  Mesh use, chronic pain,  Organ injury,  Bowel injury,  Bladder injury,   nerve injury with numbness around the incision,  Death,  and worsening of preexisting  medical problems.  The alternatives to surgery have been discussed as well..  Long term expectations of both operative and non operative treatments have been discussed.   The patient agrees to proceed.   Iseah Plouff A Laureano Hetzer

## 2024-08-17 NOTE — Anesthesia Preprocedure Evaluation (Addendum)
 Anesthesia Evaluation  Patient identified by MRN, date of birth, ID band Patient awake    Reviewed: Allergy & Precautions, NPO status , Patient's Chart, lab work & pertinent test results  History of Anesthesia Complications Negative for: history of anesthetic complications  Airway Mallampati: II  TM Distance: >3 FB Neck ROM: Full    Dental  (+) Dental Advisory Given, Teeth Intact   Pulmonary asthma    Pulmonary exam normal        Cardiovascular hypertension, Pt. on medications Normal cardiovascular exam     Neuro/Psych negative neurological ROS  negative psych ROS   GI/Hepatic negative GI ROS, Neg liver ROS,,,  Endo/Other    Class 3 obesity  Renal/GU negative Renal ROS     Musculoskeletal negative musculoskeletal ROS (+)    Abdominal  (+) + obese  Peds  Hematology  (+) Blood dyscrasia, anemia   Anesthesia Other Findings   Reproductive/Obstetrics                              Anesthesia Physical Anesthesia Plan  ASA: 3  Anesthesia Plan: General   Post-op Pain Management: Tylenol  PO (pre-op)* and Celebrex  PO (pre-op)*   Induction: Intravenous  PONV Risk Score and Plan: 3 and Treatment may vary due to age or medical condition, Ondansetron , Dexamethasone  and Midazolam   Airway Management Planned: Oral ETT  Additional Equipment: None  Intra-op Plan:   Post-operative Plan: Extubation in OR  Informed Consent: I have reviewed the patients History and Physical, chart, labs and discussed the procedure including the risks, benefits and alternatives for the proposed anesthesia with the patient or authorized representative who has indicated his/her understanding and acceptance.     Dental advisory given  Plan Discussed with: CRNA and Anesthesiologist  Anesthesia Plan Comments:          Anesthesia Quick Evaluation

## 2024-08-18 ENCOUNTER — Encounter (HOSPITAL_COMMUNITY): Payer: Self-pay | Admitting: Surgery

## 2024-08-18 NOTE — Anesthesia Postprocedure Evaluation (Signed)
 Anesthesia Post Note  Patient: Consulting Civil Engineer  Procedure(s) Performed: REPAIR, HERNIA, VENTRAL, LAPAROSCOPIC, WITH MESH (Abdomen)     Patient location during evaluation: PACU Anesthesia Type: General Level of consciousness: awake and alert Pain management: pain level controlled Vital Signs Assessment: post-procedure vital signs reviewed and stable Respiratory status: spontaneous breathing, nonlabored ventilation and respiratory function stable Cardiovascular status: stable and blood pressure returned to baseline Anesthetic complications: no   No notable events documented.  Last Vitals:  Vitals:   08/17/24 1530 08/17/24 1553  BP: (!) 141/96 (!) 131/103  Pulse: 87 85  Resp: 16 16  Temp: 36.9 C 36.8 C  SpO2: 95% 91%    Last Pain:  Vitals:   08/17/24 1553  TempSrc: Oral  PainSc:                  Debby FORBES Like
